# Patient Record
Sex: Female | Born: 1990
Health system: Southern US, Community
[De-identification: ages and names within clinical notes are randomized; demographics above are authoritative.]

## PROBLEM LIST (undated history)

## (undated) DIAGNOSIS — E669 Obesity, unspecified: Secondary | ICD-10-CM

## (undated) DIAGNOSIS — I10 Essential (primary) hypertension: Secondary | ICD-10-CM

## (undated) HISTORY — PX: TONSILLECTOMY: SUR1361

## (undated) HISTORY — PX: CHOLECYSTECTOMY: SHX55

---
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
## 2009-02-28 ENCOUNTER — Ambulatory Visit: Payer: Self-pay | Admitting: Family Medicine

## 2009-05-18 ENCOUNTER — Ambulatory Visit: Payer: Self-pay | Admitting: Family Medicine

## 2009-08-03 ENCOUNTER — Ambulatory Visit: Payer: Self-pay | Admitting: Family Medicine

## 2009-09-05 ENCOUNTER — Emergency Department (HOSPITAL_COMMUNITY): Admission: EM | Admit: 2009-09-05 | Discharge: 2009-09-06 | Payer: Self-pay | Admitting: Emergency Medicine

## 2009-10-05 ENCOUNTER — Emergency Department (HOSPITAL_COMMUNITY): Admission: EM | Admit: 2009-10-05 | Discharge: 2009-10-05 | Payer: Self-pay | Admitting: Emergency Medicine

## 2009-10-19 ENCOUNTER — Ambulatory Visit: Payer: Self-pay | Admitting: Family Medicine

## 2010-01-12 ENCOUNTER — Ambulatory Visit: Payer: Self-pay | Admitting: Family Medicine

## 2010-03-06 ENCOUNTER — Emergency Department (HOSPITAL_COMMUNITY): Admission: EM | Admit: 2010-03-06 | Discharge: 2010-03-06 | Payer: Self-pay | Admitting: Family Medicine

## 2010-04-20 ENCOUNTER — Emergency Department (HOSPITAL_COMMUNITY): Admission: EM | Admit: 2010-04-20 | Discharge: 2010-04-20 | Payer: Self-pay | Admitting: Emergency Medicine

## 2010-11-16 ENCOUNTER — Emergency Department (HOSPITAL_COMMUNITY): Admission: EM | Admit: 2010-11-16 | Discharge: 2010-11-16 | Payer: Self-pay | Admitting: Family Medicine

## 2010-12-31 NOTE — L&D Delivery Note (Cosign Needed)
Delivery Note At 4:05 AM a viable female was delivered via Vaginal, Spontaneous Delivery (Presentation: Middle Occiput Anterior).  APGAR: 6, 8; weight- not yet done Placenta status: Intact, Spontaneous via shultz.  Cord: 3 vessels with the following complications: None.  Infant placed on maternal abdomen, cord clamped x 2 and cut by fob, infant taken to warmer to be cared for by NICU team.  Anesthesia: Epidural  Episiotomy: None Lacerations: 1st degree perineal Suture Repair: 3.0 vicryl Est. Blood Loss (mL): Mom to postpartum/Women's unit.  Baby to NICU.  Zerita Boers, CNM and NICU team in attendance of birth.  Joellyn Haff, SNM 12/24/2011, 4:51 AM

## 2011-01-30 NOTE — Assessment & Plan Note (Signed)
Summary: depo inj,tcb  Nurse Visit   Vitals Entered By: Gladstone Pih (January 12, 2010 4:04 PM)  Medication Administration  Injection # 1:    Medication: Depo-Provera 150mg     Diagnosis: CONTRACEPTIVE MANAGEMENT NOS (ICD-V25.9)    Route: IM    Site: L deltoid    Exp Date: 03/2012    Lot #: [id number]    Mfr: greenstone    Comments: next inj due Mar 13-Apr14 2011, pt given card with dates to sched next visit    Patient tolerated injection without complications    Given by: Gladstone Pih (January 12, 2010 3:14 PM)  Orders Added: 1)  Depo-Provera 150mg  [J1055] 2)  Admin of Injection (IM/SQ) [25427]

## 2011-02-17 ENCOUNTER — Emergency Department (HOSPITAL_COMMUNITY)
Admission: EM | Admit: 2011-02-17 | Discharge: 2011-02-17 | Discharge status: Against Medical Advice | Payer: Self-pay | Attending: Emergency Medicine | Admitting: Emergency Medicine

## 2011-02-17 DIAGNOSIS — J45909 Unspecified asthma, uncomplicated: Secondary | ICD-10-CM | POA: Insufficient documentation

## 2011-02-24 ENCOUNTER — Emergency Department (HOSPITAL_COMMUNITY)
Admission: EM | Admit: 2011-02-24 | Discharge: 2011-02-24 | Disposition: A | Discharge status: Home / Self Care | Payer: Self-pay | Attending: Emergency Medicine | Admitting: Emergency Medicine

## 2011-02-24 DIAGNOSIS — J45909 Unspecified asthma, uncomplicated: Secondary | ICD-10-CM | POA: Insufficient documentation

## 2011-02-24 DIAGNOSIS — R0789 Other chest pain: Secondary | ICD-10-CM | POA: Insufficient documentation

## 2011-02-24 DIAGNOSIS — R05 Cough: Secondary | ICD-10-CM | POA: Insufficient documentation

## 2011-02-24 DIAGNOSIS — R059 Cough, unspecified: Secondary | ICD-10-CM | POA: Insufficient documentation

## 2011-03-13 LAB — POCT URINALYSIS DIPSTICK
Glucose, UA: NEGATIVE mg/dL
Hgb urine dipstick: NEGATIVE
Nitrite: NEGATIVE
Urobilinogen, UA: 0.2 mg/dL (ref 0.0–1.0)
pH: 7 (ref 5.0–8.0)

## 2011-04-28 ENCOUNTER — Emergency Department (HOSPITAL_COMMUNITY)
Admission: EM | Admit: 2011-04-28 | Discharge: 2011-04-28 | Disposition: A | Discharge status: Home / Self Care | Payer: Self-pay | Attending: Emergency Medicine | Admitting: Emergency Medicine

## 2011-04-28 DIAGNOSIS — Z760 Encounter for issue of repeat prescription: Secondary | ICD-10-CM | POA: Insufficient documentation

## 2011-04-28 DIAGNOSIS — J45909 Unspecified asthma, uncomplicated: Secondary | ICD-10-CM | POA: Insufficient documentation

## 2011-05-18 NOTE — H&P (Signed)
NAMESHAMELA, HAYDON             ACCOUNT NO.:  [id number]   MEDICAL RECORD NO.:  [id number]          PATIENT TYPE:  INP   LOCATION:  6120                         FACILITY:  [facility]   PHYSICIAN:  Leighton Roach McDiarmid, M.D.DATE OF BIRTH:  [birth year]   DATE OF ADMISSION:  05/07/2005  DATE OF DISCHARGE:                                HISTORY & PHYSICAL   CHIEF COMPLAINT:  Abdominal pain and vomiting.   HISTORY OF PRESENT ILLNESS:  A 20 year old African-American female,  previously healthy other than a remote history of asthma.  Awoke from sleep  at 0100 this morning with sharp epigastric pain, constant, and had  associated nausea and vomiting.  Her pain has worsened since it initially  started and remains in her epigastric region with radiation to her back.  She presented to urgent care this morning with her mom, and a CT of the  abdomen was performed.  Per verbal report to Dr. Levie Heritage, this showed  gallbladder disease.  She was sent over to [facility] pediatric floor for  admission.  She continues to have epigastric pain with radiation toward her  back.  Her pain is currently rated a 9.5/10.   REVIEW OF SYSTEMS:  CONSTITUTIONAL:  She is afebrile without chills but does  feel cold.  CARDIOVASCULAR:  Denies any chest pains or palpitations.  GASTROINTESTINAL:  Nausea and vomiting without diarrhea and epigastric pain.  NEUROLOGIC:  Denies any current headache but has a history of migraines.  ENT:  Rhinorrhea, sneezing and cough with a history of seasonal allergies.  GENITOURINARY:  Denies any dysuria or vaginal discharge.  RESPIRATORY:  Denies any cough or shortness of breath.  SKIN:  Denies any new rash but has  atopic dermatitis of her belt line and legs.  Her LMP was one week ago.   PAST MEDICAL HISTORY:  1.  Allergic rhinitis.  2.  Intermittent asthma.  3.  Migraine headaches.  4.  Obesity.   MEDICATIONS:  1.  Albuterol MDI p.r.n.  2.  Tylenol, last dose this morning.  3.  Ortho  Tri-Cyclen birth control pills daily.   ALLERGIES:  No known drug allergies.   PAST SURGICAL HISTORY:  T&A by Dr. Haroldine Laws in June 2004.   SOCIAL HISTORY:  She lives at home with her mom, stepfather and sister.  Gets along well with her stepfather.  She is active, plays sports, and is  not sexually active and does not smoke.   FAMILY HISTORY:  Her mom is healthy.  She has a distant history of coronary  artery disease and asthma.   PHYSICAL EXAMINATION:  VITAL SIGNS:  Temperature 36.5 Celsius, pulse of 85,  respiratory rate of 20, blood pressure of 112/70.  Weight is 145 pounds.  GENERAL:  A slightly overweight African-American female in moderate  distress, moaning and lying in a fetal position, alert and oriented x3.  HEENT:  Head normocephalic, atraumatic.  Pupils equal, round, and reactive.  Extraocular movements are intact.  Oropharynx is without injection.  No  tonsil was present.  Dry, pink mucous membranes.  No exudates of the palate.  NECK:  Supple without thyromegaly or lymphadenopathy.  CHEST:  Lungs clear to auscultation bilaterally, nonlabored respirations.  CARDIOVASCULAR:  Regular rate and rhythm without murmurs, 2+ dorsalis pedis  and radial pulses.  EXTREMITIES:  Without edema, clubbing or cyanosis.  ABDOMEN:  Normoactive bowel sounds.  Epigastric and right upper quadrant  voluntary guarding.  No rebound tenderness.  Positive for left CVA  tenderness.  Positive for Murphy's sign, right upper quadrant.  SKIN:  Lichenification under the umbilicus and of the legs, in the popliteal  fossae.  NEUROLOGIC:  Cranial nerves II-XII grossly intact bilaterally.  Answers  questions appropriately.  RECTAL:  Exam not performed.   LABORATORY DATA:  CBC, CMET, GGT, UA, __________, amylase, lipase and CT  abdomen pending.   ASSESSMENT AND PLAN:  A 20 year old African-American female admitted for  cholecystitis per verbal report to Dr. Levie Heritage on CT scan of the abdomen.   1.   Cholecystitis.  Await the final read of her CT scan to confirm acute      cholecystitis versus other differential.  Look for obstructing stone and      sludge of the biliary tract.  Check her LFTs along with alkaline      phosphatase, amylase and lipase to rule out associated pancreatitis if      this is from a gallstone.  2.  Epigastric pain.  Likely acute cholecystitis given the verbal report of      her CT scan, but other differentials in the diagnosis include acute      appendicitis, pancreatitis, gastritis, ectopic pregnancy, PID, although      she denies being sexually active.  Surgical consult is pending.  Will      keep her n.p.o. in case she does need to go to surgery.  Will give her      Toradol for pain and Zofran for nausea.  3.  Asthma, distant history.  Asymptomatic at this time.  Will give her      albuterol MDI if needed.  4.  Fluids, electrolytes, and nutrition.  She is slightly dehydrated but not      tachycardic.  Will start her off with 500 mL of normal saline bolus and      then give her maintenance IV fluids with D5 half normal saline at 150      mL/hr.  Will check her potassium and give her repletion if she needs it      in her IV fluids.      KB/MEDQ  D:  05/07/2005  T:  05/07/2005  Job:  [id number]   cc:   Tawnya Crook Erenest Rasher, M.D.  [address]  [place], [place] [address]  Fax: [phone]

## 2011-05-18 NOTE — H&P (Signed)
   NAME:  [name], STARRETT                       ACCOUNT NO.:  [id number]   MEDICAL RECORD NO.:  [id number]                   PATIENT TYPE:  AMB   LOCATION:  DSC                                  FACILITY:  [facility]   PHYSICIAN:  Hermelinda Medicus, M.D.                DATE OF BIRTH:  1991-10-08   DATE OF ADMISSION:  DATE OF DISCHARGE:                                HISTORY & PHYSICAL   HISTORY OF PRESENT ILLNESS:  This patient is a 20 year old female  who has  had a history of tonsillitis going back to 1999. She has had apparently  when she had larger adenoids she did have some snoring problems but this has  decreased, but the tonsillitis problems continue to be a problem. At this  point she has resolved her former adenoid hypertrophy and has very small  adenoids at age 3. However, she continues to have her tonsillitis  difficulties.   PAST MEDICAL HISTORY:  Her past history is quite unremarkable. She has been  clear  of infection but has continuing exudate on these tonsils. She has had  no surgeries in the past, no hospitalizations. She was 4 weeks premature at  4 pounds, 6 ounces but no bowel, bladder, kidney, respiratory or heart  problems.   ALLERGIES:  No allergies to medications.   CURRENT MEDICATIONS:  1. Albuterol occasionally p.r.n.  2. Benadryl occasionally.   PHYSICAL EXAMINATION:  VITAL SIGNS:  Blood pressure 123/79, pulse 80.  GENERAL:  She is 5 feet, 3 inches, weight 150.  HEENT:  Ears are clear. Tympanic membranes are clear. Nose is clear. The  tonsils are still exudative, although I do not feel she needs to be on  antibiotics at this time. Larynx is clear. The true cords, false cords,  epiglottis and base of tongue are clear.  NECK:  Free of any thyromegaly, cervical adenopathy or mass.  CHEST:  No wheezing, rales or rhonchi.  CARDIOVASCULAR:  No murmur, rub or gallops.  ABDOMEN:  Free of any organomegaly, tenderness  or mass.  EXTREMITIES:  Unremarkable.   DIAGNOSIS:  Initial diagnosis is that of tonsillitis. History of tonsillitis  documented back to 1999.                                                Hermelinda Medicus, M.D.    JC/MEDQ  D:  06/18/2003  T:  06/19/2003  Job:  [id number]   cc:   Altamease Oiler C. Merilynn Finland, M.D.  Fam. Med - Resident - [place], [place] [address]  Fax: [phone]    cc:   Osie Bond. Merilynn Finland, M.D.  Fam. Med - Resident - [place], [place] [address]  Fax: [phone]

## 2011-05-18 NOTE — Discharge Summary (Signed)
[name], [name]             ACCOUNT NO.:  [id number]   MEDICAL RECORD NO.:  [id number]          PATIENT TYPE:  INP   LOCATION:  6120                         FACILITY:  [facility]   PHYSICIAN:  Henri Medal, MDDATE OF BIRTH:  1991-03-12   DATE OF ADMISSION:  05/07/2005  DATE OF DISCHARGE:  05/10/2005                                 DISCHARGE SUMMARY   CONSULTATIONS:  Prabhakar D. Pendse, M.D.   DISCHARGE MEDICATIONS:  Tylenol No. 3 one tab p.o. q.4-6h. p.r.n. pain.   HOSPITAL COURSE:  This 20 year old African-American female complaining of  abdominal pain and vomiting.  The patient was previously healthy, awakened  from her sleep at 1 a.m. the night previous to admission with sharp  epigastric pain, constant with associated nausea and vomiting.  The pain  worsened when she presented at [facility] the morning of admission.  Abdominal CT  showed gallbladder disease.  The patient continued to have a epigastric pain  with radiation to the back.  During physical examination pain 9.5/10.   PHYSICAL EXAMINATION:  GENERAL:  The patient was in moderate distress,  moaning in fetal position.  The patient was oriented x3.  ABDOMEN:  Epigastric and right upper quadrant pain with voluntary guarding,  NABS, no rebound.  Left CVA tenderness.  Positive Murphy sign.   LABORATORY DATA:  On May 08, 2005 WBC 5.8, hemoglobin 11.8, hematocrit 33.2,  platelets 295.  On differential:  N:  66%, L: 18%, M: 11%.  AST 33, ALT 25.  Sodium 139, potassium 3.3, chloride 108, CO2 26, BUN 3, creatinine 0.9,  glucose 120.  Ultrasound showed cholelithiasis, no acute cholecystitis, no  biliary dilation.   HOSPITAL COURSE:  Problem 1. Acute cholelithiasis.  The patient was  clinically stable but still complaining of abdominal pain.  The patient was  afebrile with a white blood cell count within normal limits.  A laparoscopic  cholecystectomy was done on May 14, 2005 with no complications.  The patient  tolerated well  the procedure.  Laboratory data on day of discharge sodium  137, potassium 3.8, chloride 106, CO2 25, BUN 3, creatinine 0.9, glucose 96.  The patient's pain was controlled with medications.  The patient tolerated a  clear.  No nausea or vomiting.  The patient was discharged home with a  followup appointment with Dr. Levie Heritage at [facility] on May 17, 2005 at 2:30 p.m.  Abdominal physical examination wound no signs of  inflammatory/infection.   DISCHARGE CONDITION:  Stable.   SPECIAL INSTRUCTIONS:  1.  Diet:  Clear on May 10, 2005, fluid liquid diet on May 11, 2005 as      tolerated.  Diet can be advanced as tolerated only after two bowel      movements.  2.  Wound care:  Apply Neosporin on wound areas.  3.  Pain management:  Tylenol No. 3 for pain (one tablet q.4-6h. p.r.n.      pain).  If pain is mild discontinue Tylenol No. 3 and use regular      Tylenol 500 mg one tab p.o. q.6h.   FOLLOWUP:  Dr.  Pendse on [facility] on May 17, 2005 at 2:30  p.m.      FIM/MEDQ  D:  05/10/2005  T:  05/10/2005  Job:  [id number]   cc:   Dr. Levie Heritage (fax) [facility]   Tawnya Crook Erenest Rasher, M.D.  [address]  [place], [place] [address]  Fax: [phone]

## 2011-05-18 NOTE — Op Note (Signed)
NAMEMELANA, HINGLE             ACCOUNT NO.:  [id number]   MEDICAL RECORD NO.:  [id number]          PATIENT TYPE:  INP   LOCATION:  6120                         FACILITY:  [facility]   PHYSICIAN:  Prabhakar D. Pendse, M.D.DATE OF BIRTH:  [birth year]   DATE OF PROCEDURE:  05/09/2005  DATE OF DISCHARGE:                                 OPERATIVE REPORT   PREOPERATIVE DIAGNOSIS:  Acute cholecystitis with cholelithiasis.   POSTOPERATIVE DIAGNOSIS:  Acute cholecystitis with cholelithiasis.   OPERATION PERFORMED:  Laparoscopic cholecystectomy.   SURGEON:  Prabhakar D. Levie Heritage, M.D.   ASSISTANT:  Leonia Corona, M.D.   ANESTHESIA:  Nurse.   OPERATIVE INDICATIONS:  This 20 year old girl was referred by Dr. Royston Sinner  Corrington with about an 8-hour history of severe right-sided abdominal pain  associated with nausea and vomiting, low-grade fever and no history of URI  and no diarrhea.  Physical findings were consistent with possible  cholecystitis.  CT scan was done which showed findings consistent with a  possible gallstone.  The patient was dehydrated, hence IV fluids were given.  Pain medications were given.  Ultrasound examination was done to confirm the  findings of cholelithiasis.  Chemistries, including liver function tests  were unremarkable.  Hence, the patient was treated in a supportive manner  and a laparoscopic cholecystectomy was planned.   OPERATIVE PROCEDURE:  Under satisfactory general endotracheal anesthesia and  the patient in the supine position, the abdomen was thoroughly prepped and  draped in the usual manner.  There was a large patch of an eczematous rash  around umbilicus.  The area was cleansed and an incision was made through  this eczematous area in the infraumbilical location.  The dissection was  carried through the layers of the abdominal wall and the peritoneal cavity  entered.  A Hassan 10-mm port was placed and camera was introduced.  Careful  examination  showed findings consistent with acute cholecystitis with the  gallbladder intensely filled with gallbladder contents.  No other obvious  abnormalities were noted.  Two lateral ports 5 mm in dimension were placed  and the gallbladder was grasped and held under traction.  Another 10-mm port  was placed through the midepigastric area and with direct vision with the  camera the dissector was placed through this epigastric port and the  dissection was started.  There seemed to be marked prominence of the  infundibulum of the gallbladder and this was dissected free.  During freeing  of the infundibulum there was a perforation made in the gallbladder through  which a few stones exuded which were appropriately grasped and removed.  At  one time it was felt that the cystic duct could have been accidentally  avulsed, however, further dissection indicated that indeed the cystic duct  was intact. This was identified and followed through the common duct.  At  this time, the cystic duct was doubly clipped with hemoclips and divided.  The cystic artery was identified, doubly clipped and divided.  The  gallbladder being filled with the stone and the preoperative reading of  common duct measurements being normal as well as the  chemistries being  normal, intraoperative cholangiogram was deferred.  Now by electrocautery  with the spatula as well as the hook, the gallbladder was gradually  dissected from the gallbladder fossa.  A significant portion of the  gallbladder was in the intrahepatic position, hence it was difficult to  dissect and remove the gallbladder.  Nevertheless, this was done with  patient dissection.  The bag placed in and the gallbladder was retrieved and  removed.  A few stones spilled which were grasped and removed.  The entire  abdominal cavity was irrigated with saline, hemostasis accomplished and all  the ports were removed under direct vision with the camera.  There was no  bleeding  noted and all the incisions were infiltrated with 0.25% Marcaine  with epinephrine and repaired with staples only and in a loose manner  because of the eczematoid rash in the area of the incisions.  The incisions  were now cleansed and appropriately dressed.   Throughout the procedure, the patient's vital signs remained stable. The  patient withstood the procedure well and was transferred to the recovery  room in satisfactory general condition.      PDP/MEDQ  D:  05/09/2005  T:  05/09/2005  Job:  [id number]   cc:   Delano Metz, M.D.  [address]  [place]  [place] [address]  Fax: [phone]   Danella Deis, M.D.  [address]  [place], [place] [address]

## 2011-05-18 NOTE — Op Note (Signed)
   NAME:  [name], [name]                       ACCOUNT NO.:  [id number]   MEDICAL RECORD NO.:  [id number]                   PATIENT TYPE:  AMB   LOCATION:  DSC                                  FACILITY:  [facility]   PHYSICIAN:  Hermelinda Medicus, M.D.                DATE OF BIRTH:  July 25, 1991   DATE OF PROCEDURE:  DATE OF DISCHARGE:                                 OPERATIVE REPORT   PREOPERATIVE DIAGNOSIS:  Tonsillitis, history of tonsillitis since 1999.   POSTOPERATIVE DIAGNOSIS:  Tonsillitis, history of tonsillitis since 1999.   SURGEON:  Hermelinda Medicus, M.D.   ANESTHESIA:  General endotracheal anesthesia with Dr. Kaylyn Layer. [place].   DESCRIPTION OF PROCEDURE:  The patient was placed in the supine position and  under general endotracheal anesthesia the tonsillar gag was placed. The  adenoids were checked and found to be extremely small  and no longer  obstructive as described in previous notes when the child was younger.   The tonsils are showing considerable exudate and are moderate in size and  showed no indication of further history of sleep apneas as described in the  past. The tonsils, however, showed considerable exudate, and when grasped,  considerably more exudate exuded from these. They are removed using the  Bovie electrocoagulation, blunt dissection. All hemostasis was established  using Bovie electrocoagulation.   Once the tonsils were removed the tonsillar beds were carefully examined and  carefully  cauterized all tonsillar bleeders. Once all hemostasis was  established the nasopharynx was suctioned. The stomach was suctioned. The  gag was slowly released. All bleeding was stopped. The total blood loss was  estimated at less than 25 cc.   The patient tolerated the procedure well. She is doing well postoperatively.  Follow up will be in 5 days, 3 weeks and 6 weeks.                                               Hermelinda Medicus, M.D.    JC/MEDQ  D:  06/18/2003  T:   06/21/2003  Job:  [id number]   cc:   Altamease Oiler C. Merilynn Finland, M.D.  Fam. Med - Resident - [place], [place] [address]  Fax: [phone]

## 2011-06-21 ENCOUNTER — Inpatient Hospital Stay (INDEPENDENT_AMBULATORY_CARE_PROVIDER_SITE_OTHER)
Admission: RE | Admit: 2011-06-21 | Discharge: 2011-06-21 | Disposition: A | Discharge status: Home / Self Care | Payer: Self-pay | Source: Ambulatory Visit | Attending: Family Medicine | Admitting: Family Medicine

## 2011-06-21 DIAGNOSIS — Z3201 Encounter for pregnancy test, result positive: Secondary | ICD-10-CM

## 2011-06-21 LAB — POCT URINALYSIS DIP (DEVICE)
Hgb urine dipstick: NEGATIVE
Nitrite: NEGATIVE
Urobilinogen, UA: 1 mg/dL (ref 0.0–1.0)
pH: 6.5 (ref 5.0–8.0)

## 2011-06-22 ENCOUNTER — Inpatient Hospital Stay (HOSPITAL_COMMUNITY): Payer: Medicaid Other

## 2011-06-22 ENCOUNTER — Inpatient Hospital Stay (HOSPITAL_COMMUNITY)
Admission: AD | Admit: 2011-06-22 | Discharge: 2011-06-22 | Disposition: A | Discharge status: Home / Self Care | Payer: Medicaid Other | Source: Ambulatory Visit | Attending: Obstetrics & Gynecology | Admitting: Obstetrics & Gynecology

## 2011-06-22 DIAGNOSIS — Z79899 Other long term (current) drug therapy: Secondary | ICD-10-CM | POA: Insufficient documentation

## 2011-06-22 DIAGNOSIS — O2390 Unspecified genitourinary tract infection in pregnancy, unspecified trimester: Secondary | ICD-10-CM | POA: Insufficient documentation

## 2011-06-22 DIAGNOSIS — O30009 Twin pregnancy, unspecified number of placenta and unspecified number of amniotic sacs, unspecified trimester: Secondary | ICD-10-CM

## 2011-06-22 DIAGNOSIS — J45909 Unspecified asthma, uncomplicated: Secondary | ICD-10-CM | POA: Insufficient documentation

## 2011-06-22 DIAGNOSIS — A499 Bacterial infection, unspecified: Secondary | ICD-10-CM | POA: Insufficient documentation

## 2011-06-22 DIAGNOSIS — O99891 Other specified diseases and conditions complicating pregnancy: Secondary | ICD-10-CM | POA: Insufficient documentation

## 2011-06-22 DIAGNOSIS — O9989 Other specified diseases and conditions complicating pregnancy, childbirth and the puerperium: Secondary | ICD-10-CM

## 2011-06-22 DIAGNOSIS — R109 Unspecified abdominal pain: Secondary | ICD-10-CM | POA: Insufficient documentation

## 2011-06-22 DIAGNOSIS — N760 Acute vaginitis: Secondary | ICD-10-CM | POA: Insufficient documentation

## 2011-06-22 DIAGNOSIS — B9689 Other specified bacterial agents as the cause of diseases classified elsewhere: Secondary | ICD-10-CM | POA: Insufficient documentation

## 2011-06-22 LAB — URINE MICROSCOPIC-ADD ON

## 2011-06-22 LAB — URINALYSIS, ROUTINE W REFLEX MICROSCOPIC
Bilirubin Urine: NEGATIVE
Glucose, UA: NEGATIVE mg/dL
Hgb urine dipstick: NEGATIVE
Protein, ur: NEGATIVE mg/dL
Urobilinogen, UA: 0.2 mg/dL (ref 0.0–1.0)

## 2011-06-22 LAB — CBC
HCT: 37.9 % (ref 36.0–46.0)
MCHC: 34.8 g/dL (ref 30.0–36.0)
MCV: 84 fL (ref 78.0–100.0)
Platelets: 353 10*3/uL (ref 150–400)
RDW: 15.5 % (ref 11.5–15.5)
WBC: 7 10*3/uL (ref 4.0–10.5)

## 2011-06-22 LAB — WET PREP, GENITAL
Trich, Wet Prep: NONE SEEN
Yeast Wet Prep HPF POC: NONE SEEN

## 2011-06-29 ENCOUNTER — Ambulatory Visit (HOSPITAL_COMMUNITY)
Admit: 2011-06-29 | Discharge: 2011-06-29 | Disposition: A | Discharge status: Home / Self Care | Payer: Medicaid Other | Attending: Obstetrics & Gynecology | Admitting: Obstetrics & Gynecology

## 2011-06-29 ENCOUNTER — Inpatient Hospital Stay (HOSPITAL_COMMUNITY)
Admission: AD | Admit: 2011-06-29 | Discharge: 2011-06-29 | Disposition: A | Discharge status: Home / Self Care | Payer: Self-pay | Source: Ambulatory Visit | Attending: Obstetrics & Gynecology | Admitting: Obstetrics & Gynecology

## 2011-06-29 ENCOUNTER — Encounter (HOSPITAL_COMMUNITY): Payer: Self-pay

## 2011-06-29 DIAGNOSIS — Z3689 Encounter for other specified antenatal screening: Secondary | ICD-10-CM | POA: Insufficient documentation

## 2011-06-29 DIAGNOSIS — O209 Hemorrhage in early pregnancy, unspecified: Secondary | ICD-10-CM

## 2011-06-29 DIAGNOSIS — R109 Unspecified abdominal pain: Secondary | ICD-10-CM | POA: Insufficient documentation

## 2011-06-29 DIAGNOSIS — O99891 Other specified diseases and conditions complicating pregnancy: Secondary | ICD-10-CM | POA: Insufficient documentation

## 2011-07-25 ENCOUNTER — Encounter (HOSPITAL_COMMUNITY): Payer: Self-pay

## 2011-07-25 ENCOUNTER — Inpatient Hospital Stay (HOSPITAL_COMMUNITY)
Admission: AD | Admit: 2011-07-25 | Discharge: 2011-07-25 | Disposition: A | Discharge status: Home / Self Care | Payer: BC Managed Care – PPO | Source: Ambulatory Visit | Attending: Obstetrics and Gynecology | Admitting: Obstetrics and Gynecology

## 2011-07-25 DIAGNOSIS — O209 Hemorrhage in early pregnancy, unspecified: Secondary | ICD-10-CM | POA: Insufficient documentation

## 2011-07-25 LAB — WET PREP, GENITAL
Trich, Wet Prep: NONE SEEN
Yeast Wet Prep HPF POC: NONE SEEN

## 2011-07-25 LAB — URINE MICROSCOPIC-ADD ON

## 2011-07-25 LAB — URINALYSIS, ROUTINE W REFLEX MICROSCOPIC
Bilirubin Urine: NEGATIVE
Glucose, UA: NEGATIVE mg/dL
Specific Gravity, Urine: >1.03 — ABNORMAL HIGH (ref 1.005–1.030)
pH: 6 (ref 5.0–8.0)

## 2011-07-25 NOTE — Progress Notes (Signed)
Pt states she had a sudden onset of bleeding at 1430, no pain. Pt has not pad on but the R side of her panties are saturated with dark red blood with a foul odor.

## 2011-07-25 NOTE — ED Provider Notes (Cosign Needed Addendum)
Informal bedside ultrasound shows viable IUP.  I was able to doppler FHT at 170's. Discussed with patient, bleeding during pregnancy and threatened AB.  [place], [place] 08/04/11 1734

## 2011-07-25 NOTE — Progress Notes (Signed)
Pt states started bleeding at 1430 this afternoon, last intercourse Sunday, now spotting. When pt first saw blood, was dark, no clots noted. Denies pain.

## 2011-07-25 NOTE — ED Provider Notes (Addendum)
History     Chief Complaint  Patient presents with  . Vaginal Bleeding   Vaginal Bleeding  Patient is a 20 y.o. female presenting with vaginal bleeding.  Vaginal Bleeding      Past Medical History  Diagnosis Date  . Asthma     Past Surgical History  Procedure Date  . Cholecystectomy     History reviewed. No pertinent family history.  History  Substance Use Topics  . Smoking status: Never Smoker   . Smokeless tobacco: Never Used  . Alcohol Use: No    Allergies: No Known Allergies  No prescriptions prior to admission    Review of Systems  Genitourinary: Positive for vaginal bleeding.   Physical Exam   Blood pressure 133/70, pulse 108, temperature 99.3 F (37.4 C), temperature source Oral, resp. rate 16, height 5' 2.5" (1.588 m), weight 219 lb (99.338 kg), SpO2 100.00%.  Physical Exam  MAU Course  Procedures  MDM Assessment: bleeding in first trimester pregnancy Plan: Inst. To patient regarding threatened AB.   Assessment and Plan    Avni Traore 08/04/2011, 5:35 PM               [facility] NP 08/04/11 [address] [place], NP 08/04/11 [address] [place], NP 08/07/11 0049  [place], NP 09/07/11 [phone]

## 2011-07-25 NOTE — ED Provider Notes (Deleted)
History   [name] is a 20 y.o. G1 at 10 wks. Gest. By early ultrasound here. She states that when she was five weeks pregnant her ultrasound showed an IUP and [facility]. She is here today with bleeding but no pain. Last sexual intercourse 3 days ago.   Chief Complaint  Patient presents with  . Vaginal Bleeding   Vaginal Bleeding The patient's primary symptoms include vaginal bleeding. This is a new problem. The current episode started today. The problem has been gradually improving. The patient is experiencing no pain. She is pregnant. Associated symptoms include back pain, constipation and nausea. Pertinent negatives include no abdominal pain, chills, diarrhea, dysuria, fever, frequency, headaches, sore throat, urgency or vomiting. The vaginal bleeding is lighter than menses. She has not been passing clots. She has not been passing tissue. The symptoms are aggravated by nothing. She has tried nothing for the symptoms. She is sexually active. She uses nothing for contraception.   [name] is a 20 y.o. AA female at 10 wks. Gest with vaginal bleeding that started today. The bleeding began bright red like a period and then got less until now is only dark brown. She denies any pain.  OB History    Grav Para Term Preterm Abortions TAB SAB Ect Mult Living   1               Past Medical History  Diagnosis Date  . Asthma     Past Surgical History  Procedure Date  . Cholecystectomy     History reviewed. No pertinent family history.  History  Substance Use Topics  . Smoking status: Never Smoker   . Smokeless tobacco: Never Used  . Alcohol Use: No    Allergies: No Known Allergies  Prescriptions prior to admission  Medication Sig Dispense Refill  . prenatal vitamin w/FE, FA (PRENATAL 1 + 1) 27-1 MG TABS Take 1 tablet by mouth daily.          Review of Systems  Constitutional: Positive for malaise/fatigue. Negative for fever, chills and weight loss.  HENT: Negative for  ear pain, congestion, sore throat and neck pain.   Eyes: Positive for blurred vision. Negative for double vision and photophobia.  Cardiovascular: Negative for chest pain, palpitations and leg swelling.  Gastrointestinal: Positive for nausea and constipation. Negative for heartburn, vomiting, abdominal pain and diarrhea.  Genitourinary: Positive for vaginal bleeding. Negative for dysuria, urgency and frequency.  Musculoskeletal: Positive for back pain.  Skin: Negative.   Neurological: Negative for dizziness, speech change, focal weakness and headaches.  Endo/Heme/Allergies: Negative.    Physical Exam   Blood pressure 140/81, pulse 116, temperature 99.3 F (37.4 C), temperature source Oral, resp. rate 16, height 5' 2.5" (1.588 m), weight 99.338 kg (219 lb), SpO2 100.00%.  Physical Exam  Nursing note and vitals reviewed. Constitutional: She is oriented to person, place, and time. She appears well-developed and well-nourished.  HENT:  Head: Normocephalic and atraumatic.  Eyes: EOM are normal.  Neck: Neck supple.  Respiratory: Effort normal.  GI: Soft. There is no tenderness.  Genitourinary: Uterus is enlarged. Cervix exhibits no motion tenderness. Right adnexum displays no tenderness. Left adnexum displays no tenderness.       Small amount of blood in vaginal vault. Cervix long and closed.   Musculoskeletal: Normal range of motion.  Neurological: She is alert and oriented to person, place, and time.  Skin: Skin is warm and dry.    MAU Course  Procedures  MDM  Assessment and Plan  Assessment: bleeding in first trimester pregnancy  Plan: Inst. To patient regarding threatened AB.    Claire Bridge 07/25/2011, 3:48 PM

## 2011-08-15 ENCOUNTER — Emergency Department (HOSPITAL_COMMUNITY)
Admission: EM | Admit: 2011-08-15 | Discharge: 2011-08-15 | Disposition: A | Discharge status: Home / Self Care | Payer: Self-pay | Attending: Emergency Medicine | Admitting: Emergency Medicine

## 2011-08-15 DIAGNOSIS — J45909 Unspecified asthma, uncomplicated: Secondary | ICD-10-CM | POA: Insufficient documentation

## 2011-08-28 ENCOUNTER — Other Ambulatory Visit: Payer: Medicaid Other

## 2011-08-28 DIAGNOSIS — Z3480 Encounter for supervision of other normal pregnancy, unspecified trimester: Secondary | ICD-10-CM

## 2011-08-28 NOTE — Progress Notes (Signed)
Prenatal labs done today marci holder 

## 2011-08-29 ENCOUNTER — Other Ambulatory Visit: Payer: Self-pay | Admitting: Family Medicine

## 2011-08-29 LAB — OBSTETRIC PANEL
Eosinophils Relative: 2 % (ref 0–5)
HCT: 36.7 % (ref 36.0–46.0)
Hemoglobin: 12.2 g/dL (ref 12.0–15.0)
Lymphocytes Relative: 16 % (ref 12–46)
Lymphs Abs: 1.2 10*3/uL (ref 0.7–4.0)
MCV: 85.3 fL (ref 78.0–100.0)
Monocytes Relative: 7 % (ref 3–12)
Platelets: 365 10*3/uL (ref 150–400)
RBC: 4.3 MIL/uL (ref 3.87–5.11)
Rh Type: POSITIVE
Rubella: 33.2 IU/mL — ABNORMAL HIGH
WBC: 7.8 10*3/uL (ref 4.0–10.5)

## 2011-08-29 LAB — SICKLE CELL SCREEN: Sickle Cell Screen: NEGATIVE

## 2011-09-04 ENCOUNTER — Other Ambulatory Visit (HOSPITAL_COMMUNITY)
Admission: RE | Admit: 2011-09-04 | Discharge: 2011-09-04 | Disposition: A | Discharge status: Home / Self Care | Payer: Medicaid Other | Source: Ambulatory Visit | Attending: Family Medicine | Admitting: Family Medicine

## 2011-09-04 ENCOUNTER — Encounter: Payer: Self-pay | Admitting: Family Medicine

## 2011-09-04 ENCOUNTER — Ambulatory Visit (INDEPENDENT_AMBULATORY_CARE_PROVIDER_SITE_OTHER): Payer: Medicaid Other | Admitting: Family Medicine

## 2011-09-04 DIAGNOSIS — Z01419 Encounter for gynecological examination (general) (routine) without abnormal findings: Secondary | ICD-10-CM | POA: Insufficient documentation

## 2011-09-04 DIAGNOSIS — Z124 Encounter for screening for malignant neoplasm of cervix: Secondary | ICD-10-CM

## 2011-09-04 DIAGNOSIS — Z3480 Encounter for supervision of other normal pregnancy, unspecified trimester: Secondary | ICD-10-CM

## 2011-09-04 LAB — GLUCOSE, CAPILLARY: Glucose-Capillary: 113 mg/dL — ABNORMAL HIGH (ref 70–99)

## 2011-09-04 NOTE — Patient Instructions (Addendum)
Please make a follow-up appointment at the [facility] clinic in 4 weeks and with me in 8 weeks.   If your lab results are normal, I will send you a letter with the results. If abnormal, someone at the clinic will get in touch with you.   It was a pleasure to meet you.

## 2011-09-04 NOTE — Progress Notes (Signed)
SHx:    Lives with boyfriend and FOB Nanetta Batty) who is studying to be an Retail buyer. Also 2 dogs.   Patient works at [employer].    Denies history of smoking, no longer drinking alcohol after finding out she was pregnant (drank occasionally), denies other drug use.   A/P: G1 without any concerning risk factors. 1. Initial labs done at [facility] where she went due to some vaginal bleeding attributed to dislodgement of mucous.  2. Will do pap smear today. 3. Will perform 1 hour glucola today due to risk factors obesity & African-American.  4. Will schedule you for your anatomic ultrasound @ 18 weeks. 5. Follow-up in 1 month at [facility] clinic then with me in 2 months.  6. Weight gain goal is 11-20 pounds during pregnancy. Discussed continuing to exercise regularly and healthy, high-fiber diet with 8-10 glasses water daily.  7. PNV causes nausea/vomiting, so taking Gummi vitamins (without iron). Told her this would be okay as long as she is taking 0.6mg  of folic acid daily.

## 2011-09-06 ENCOUNTER — Encounter: Payer: Self-pay | Admitting: Family Medicine

## 2011-09-13 ENCOUNTER — Telehealth: Payer: Self-pay | Admitting: Family Medicine

## 2011-09-13 NOTE — Telephone Encounter (Signed)
Ms. Postlethwait has referral entered for U/S but doesn't see with whom are when.   Please contact with appt info.

## 2011-09-17 NOTE — Telephone Encounter (Signed)
Ultrasound scheduled for 9/27 at 2:30pm at [facility], patient informed.

## 2011-09-20 ENCOUNTER — Ambulatory Visit (INDEPENDENT_AMBULATORY_CARE_PROVIDER_SITE_OTHER): Payer: Medicaid Other | Admitting: Family Medicine

## 2011-09-20 VITALS — BP 132/80 | Temp 98.7°F | Wt 219.0 lb

## 2011-09-20 DIAGNOSIS — Z331 Pregnant state, incidental: Secondary | ICD-10-CM

## 2011-09-20 DIAGNOSIS — Z23 Encounter for immunization: Secondary | ICD-10-CM

## 2011-09-20 NOTE — Patient Instructions (Signed)
It was a pleasure to see you today in [facility]!  You are at 18 weeks and 1 day today!  You received a flu shot today.   I am glad you will bring your baby to the [facility]!  We are glad to be the doctors for your whole family.  Your anatomy scan ultrasound is scheduled at [facility] on Thursday, Sept 27 at 2:30pm; please keep this appointment.  Please make a follow up OB appointment here with Dr Madolyn Frieze in 4 weeks.

## 2011-09-20 NOTE — Progress Notes (Signed)
Patient seen in [facility].  No complaints.  Beginning to feel quickening. No headaches, nausea or vomiting.  Fundus palpable just above the umbilicus. Has US anatomy scan scheduled at [facility] for Thursday, Sept 27th.   Would be interested in perinatal education classes at [facility].  Would give her the Perinatal Education phone number [phone] or [phone] at next visit; she was discharged before we could give her these numbers.  Plans to bring infant to [facility] for routine care.  Follow up OB visit in 4 weeks.

## 2011-09-20 NOTE — Progress Notes (Signed)
Called patient at home to give the phone numbers to Perinatal Education office at [facility].  Also, patient reports that she was counseled about genetics screening earlier in the pregnancy and had declined, repeats that decision today. [name]

## 2011-09-27 ENCOUNTER — Ambulatory Visit (HOSPITAL_COMMUNITY)
Admission: RE | Admit: 2011-09-27 | Discharge: 2011-09-27 | Disposition: A | Discharge status: Home / Self Care | Payer: Medicaid Other | Source: Ambulatory Visit | Attending: Family Medicine | Admitting: Family Medicine

## 2011-09-27 DIAGNOSIS — Z1389 Encounter for screening for other disorder: Secondary | ICD-10-CM | POA: Insufficient documentation

## 2011-09-27 DIAGNOSIS — O358XX0 Maternal care for other (suspected) fetal abnormality and damage, not applicable or unspecified: Secondary | ICD-10-CM | POA: Insufficient documentation

## 2011-09-27 DIAGNOSIS — Z363 Encounter for antenatal screening for malformations: Secondary | ICD-10-CM | POA: Insufficient documentation

## 2011-09-27 LAB — POCT URINALYSIS DIP (DEVICE)
Glucose, UA: NEGATIVE
Nitrite: NEGATIVE
Operator id: 282151
Specific Gravity, Urine: 1.02
Urobilinogen, UA: >=8

## 2011-09-27 LAB — POCT PREGNANCY, URINE: Preg Test, Ur: NEGATIVE

## 2011-10-02 ENCOUNTER — Encounter: Payer: Medicaid Other | Admitting: Family Medicine

## 2011-10-02 ENCOUNTER — Other Ambulatory Visit: Payer: Self-pay | Admitting: Family Medicine

## 2011-10-02 DIAGNOSIS — R9389 Abnormal findings on diagnostic imaging of other specified body structures: Secondary | ICD-10-CM

## 2011-10-02 NOTE — Assessment & Plan Note (Signed)
Dr. Mauricio Po comments: while this may be incidental, may be associated with fetal growth restriction and maternal thrombophilic conditions, recommends close follow-up in 4 weeks. Would consider formal MFM or HROB consult.   Will schedule repeat ultrasound. Will put in order for referral to HR[facility] after the repeat ultrasound for a consultation visit.  Spoke with patient and discussed plan.

## 2011-10-03 ENCOUNTER — Emergency Department (HOSPITAL_COMMUNITY)
Admission: EM | Admit: 2011-10-03 | Discharge: 2011-10-04 | Discharge status: Against Medical Advice | Payer: BC Managed Care – PPO | Attending: Emergency Medicine | Admitting: Emergency Medicine

## 2011-10-03 DIAGNOSIS — Z0389 Encounter for observation for other suspected diseases and conditions ruled out: Secondary | ICD-10-CM | POA: Insufficient documentation

## 2011-10-08 ENCOUNTER — Other Ambulatory Visit: Payer: Self-pay | Admitting: Family Medicine

## 2011-10-08 DIAGNOSIS — Z1389 Encounter for screening for other disorder: Secondary | ICD-10-CM

## 2011-10-11 ENCOUNTER — Ambulatory Visit (HOSPITAL_COMMUNITY)
Admission: RE | Admit: 2011-10-11 | Discharge: 2011-10-11 | Disposition: A | Discharge status: Home / Self Care | Payer: Medicaid Other | Source: Ambulatory Visit | Attending: Family Medicine | Admitting: Family Medicine

## 2011-10-11 DIAGNOSIS — Z1389 Encounter for screening for other disorder: Secondary | ICD-10-CM

## 2011-10-11 DIAGNOSIS — Z3689 Encounter for other specified antenatal screening: Secondary | ICD-10-CM | POA: Insufficient documentation

## 2011-10-11 DIAGNOSIS — O43899 Other placental disorders, unspecified trimester: Secondary | ICD-10-CM | POA: Insufficient documentation

## 2011-10-12 NOTE — Assessment & Plan Note (Signed)
Documentation only. Repeat U/S on 10/11 shows no change in appearance of preplacental thrombus. Suggest follow-up in 2 weeks to assess for appropriate linear growth.  Faxed information to [facility] clinic to set-up appointment for this patient for evaluation regarding this issue. Before scheduling repeat ultrasound in 2 weeks, will wait and see what the providers there advise.

## 2011-10-26 ENCOUNTER — Ambulatory Visit (INDEPENDENT_AMBULATORY_CARE_PROVIDER_SITE_OTHER): Payer: Medicaid Other | Admitting: Family Medicine

## 2011-10-26 VITALS — BP 125/85 | Temp 98.0°F | Wt 220.0 lb

## 2011-10-26 DIAGNOSIS — Z3480 Encounter for supervision of other normal pregnancy, unspecified trimester: Secondary | ICD-10-CM

## 2011-10-26 DIAGNOSIS — Z3490 Encounter for supervision of normal pregnancy, unspecified, unspecified trimester: Secondary | ICD-10-CM

## 2011-10-26 DIAGNOSIS — R9389 Abnormal findings on diagnostic imaging of other specified body structures: Secondary | ICD-10-CM

## 2011-10-26 DIAGNOSIS — J45909 Unspecified asthma, uncomplicated: Secondary | ICD-10-CM

## 2011-10-26 MED ORDER — ALBUTEROL 90 MCG/ACT IN AERS: 2.0000 | INHALATION_SPRAY | Freq: Four times a day (QID) | RESPIRATORY_TRACT | Status: DC | PRN

## 2011-10-26 NOTE — Patient Instructions (Addendum)
It was so nice to see you today.  Jaci Carrel (I hope I spelled it right!) is a lovely name.  Please make an appointment to go to the [facility] Ob clinic.  We will let you know about your appointment for your repeat ultrasound.  Follow-up with me in 4 weeks.

## 2011-10-26 NOTE — Progress Notes (Signed)
Doing well. No complaints. Baby's name is [name].  1. Given pre-term labor precautions. 2. Patient missed HROB clinic due to not being able to make it at that time. Asked her to call back and re-schedule appointment to see them. 3. Will order recommended follow-up ultrasound to monitor preplacental mass. 4. Follow-up with me in 4 weeks.

## 2011-10-30 ENCOUNTER — Ambulatory Visit (HOSPITAL_COMMUNITY)
Admission: RE | Admit: 2011-10-30 | Discharge: 2011-10-30 | Disposition: A | Discharge status: Home / Self Care | Payer: Medicaid Other | Source: Ambulatory Visit | Attending: Family Medicine | Admitting: Family Medicine

## 2011-10-30 DIAGNOSIS — O43899 Other placental disorders, unspecified trimester: Secondary | ICD-10-CM | POA: Insufficient documentation

## 2011-10-30 DIAGNOSIS — R9389 Abnormal findings on diagnostic imaging of other specified body structures: Secondary | ICD-10-CM

## 2011-10-30 DIAGNOSIS — Z3689 Encounter for other specified antenatal screening: Secondary | ICD-10-CM | POA: Insufficient documentation

## 2011-11-01 ENCOUNTER — Telehealth: Payer: Self-pay | Admitting: Family Medicine

## 2011-11-01 NOTE — ED Provider Notes (Signed)
Agree with above note.  Tyshell Ramberg 11/01/2011 12:47 PM   

## 2011-11-01 NOTE — Progress Notes (Signed)
Update: follow-up ultrasound shows preplacental mass is unchanged in size but appears more disorganized. Spoke with patient who will make appointment with HR Ob for evaluation today.

## 2011-11-01 NOTE — Telephone Encounter (Signed)
Informed of ultrasound results. No change in size of preplacental mass but looks more disorganized. Patient will call [facility] for appointment today for evaluation regarding this.

## 2011-11-16 DIAGNOSIS — Z3490 Encounter for supervision of normal pregnancy, unspecified, unspecified trimester: Secondary | ICD-10-CM | POA: Insufficient documentation

## 2011-11-26 ENCOUNTER — Ambulatory Visit (INDEPENDENT_AMBULATORY_CARE_PROVIDER_SITE_OTHER): Payer: Medicaid Other | Admitting: Family Medicine

## 2011-11-26 VITALS — BP 129/76 | Temp 98.4°F | Wt 219.7 lb

## 2011-11-26 DIAGNOSIS — Z3480 Encounter for supervision of other normal pregnancy, unspecified trimester: Secondary | ICD-10-CM

## 2011-11-26 DIAGNOSIS — Z3490 Encounter for supervision of normal pregnancy, unspecified, unspecified trimester: Secondary | ICD-10-CM

## 2011-11-26 LAB — CBC
Hemoglobin: 11.6 g/dL — ABNORMAL LOW (ref 12.0–15.0)
MCHC: 33.3 g/dL (ref 30.0–36.0)
Platelets: 302 10*3/uL (ref 150–400)

## 2011-11-26 LAB — HIV ANTIBODY (ROUTINE TESTING W REFLEX): HIV: NONREACTIVE

## 2011-11-26 NOTE — Progress Notes (Signed)
This is a G1 @ 27 5/7. Doing well. FOB supportive. Just recently promoted to Production designer, theatre/television/film at [employer].  1. Some pain in the upper abdominal region occasionally. Does not seem to be GERD (not associated with eating, no acid taste). Lasts several minutes. Does not cause significant bother. Will monitor for now. 2. Will do 1-hour glucola at next visit. Patient declined today. Did early 1 hour glucola which was normal. 3. Will check CBC, RPR, HIV, type and screen today. 4. Follow-up at [facility] clinic in 2 weeks. Follow-up with me in 4 weeks.  5. Patient has follow-up ultrasound to monitor pre-placental mass on 12/11/2011. She still has not made an appointment to be evaluated at [facility]. She just got a promotion and has been busy. But says she will call and make an appointment today. Preplacental mass is not changing in size based on U/S end of October, but does appear more disorganized. 6. Will breast feed. 7. Contraception: considering Implanon, OCPs 8. Pediatrician: [facility]

## 2011-11-26 NOTE — Progress Notes (Signed)
Addended by: Jone Baseman D on: 11/26/2011 10:33 AM   Modules accepted: Orders

## 2011-11-26 NOTE — Patient Instructions (Addendum)
Follow-up at [facility] clinic in 2 weeks here.  Please call [facility] Ob clinic and schedule appointment today.  I am glad things are going well for you.  It was nice to see you today.

## 2011-11-27 LAB — RPR

## 2011-12-11 ENCOUNTER — Ambulatory Visit (HOSPITAL_COMMUNITY)
Admission: RE | Admit: 2011-12-11 | Discharge: 2011-12-11 | Disposition: A | Discharge status: Home / Self Care | Payer: Medicaid Other | Source: Ambulatory Visit | Attending: Family Medicine | Admitting: Family Medicine

## 2011-12-11 DIAGNOSIS — Z3689 Encounter for other specified antenatal screening: Secondary | ICD-10-CM | POA: Insufficient documentation

## 2011-12-11 DIAGNOSIS — O43899 Other placental disorders, unspecified trimester: Secondary | ICD-10-CM | POA: Insufficient documentation

## 2011-12-11 DIAGNOSIS — R9389 Abnormal findings on diagnostic imaging of other specified body structures: Secondary | ICD-10-CM

## 2011-12-13 ENCOUNTER — Ambulatory Visit (INDEPENDENT_AMBULATORY_CARE_PROVIDER_SITE_OTHER): Payer: Medicaid Other | Admitting: Family Medicine

## 2011-12-13 VITALS — BP 130/80 | Wt 219.5 lb

## 2011-12-13 DIAGNOSIS — Z3490 Encounter for supervision of normal pregnancy, unspecified, unspecified trimester: Secondary | ICD-10-CM

## 2011-12-13 DIAGNOSIS — Z3480 Encounter for supervision of other normal pregnancy, unspecified trimester: Secondary | ICD-10-CM

## 2011-12-13 DIAGNOSIS — Z23 Encounter for immunization: Secondary | ICD-10-CM

## 2011-12-13 NOTE — Patient Instructions (Addendum)
It was great meeting you today!  Follow up with Dr. Sharol Given in 2 weeks.  Dr. Sharol Given will schedule the ultrasound at your next visit.   SEEK MEDICAL CARE IF:   You develop a temperature of 100 F (37.8 C) or higher.   You have a bloody mucus discharge from the vagina (a bloody show).  SEEK IMMEDIATE MEDICAL CARE IF:   You do not feel the baby move.   You think the baby's movements are too little or too many.   You develop contractions.   You develop vaginal bleeding.   You have belly (abdominal) pain.   You have leaking or a gush of fluid from the vagina.  Document Released: 12/07/2002 Document Revised: 08/29/2011 Document Reviewed: 04/11/2009 [facility] Patient Information 2012 [place], [place].  Fetal Movement Counts Kick counts is highly recommended in high risk pregnancies, but it is a good idea for every pregnant woman to do. Start counting fetal movements at 28 weeks of the pregnancy. Fetal movements increase after eating a full meal or eating or drinking something sweet (the blood sugar is higher). It is also important to drink plenty of fluids (well hydrated) before doing the count. Lie on your left side because it helps with the circulation or you can sit in a comfortable chair with your arms over your belly (abdomen) with no distractions around you. DOING THE COUNT  Try to do the count the same time of day each time you do it.   Mark the day and time, then see how long it takes for you to feel 10 movements (kicks, flutters, swishes, rolls). You should have at least 10 movements within 2 hours. You will most likely feel 10 movements in much less than 2 hours. If you do not, wait an hour and count again. After a couple of days you will see a pattern.   What you are looking for is a change in the pattern or not enough counts in 2 hours. Is it taking longer in time to reach 10 movements?  SEEK MEDICAL CARE IF:  You feel less than 10 counts in 2 hours. Tried twice.   No  movement in one hour.   The pattern is changing or taking longer each day to reach 10 counts in 2 hours.   You feel the baby is not moving as it usually does.   [employer] Patient Information 2012 [place], [place].

## 2011-12-13 NOTE — Progress Notes (Signed)
G1 at 30.1wga who presents to [facility] clinic. Doing well. No complaints today. She continues working 50+ hr weeks and plans on cutting back in a couple of weeks because she has been feeling a little more tired. She has signed up for the birthing classes at [facility] and will start those in January. She doesn't plan on any anesthesia during labor for now. No worsening of her asthma.  Pediatrician: [facility] Plans on breastfeeding. Does not have a car seat yet.  Was told that the thrombus resolved on ultrasound and advised to get repeat US in 4-6 weeks to assess growth.  PE:  Pulm: Clear to auscultation bilaterally, no wheezing. CV: S1S2, RRR, no murmurs, rubs or gallops Fundal height measurement: 33cm. Epic flowsheet did not show previous fundal heights. Unable to assess interval measurement. Plan: 1. Repeat US in 4-6weeks per radiologist recs to assess growth. Since thrombus has resolved, no need to refer to high risk OB for now. Patient measuring slightly bigger than dates. Since previous fundal height measurements do not appear, it is difficult to assess interval growth since last visit. Repeat US in 4-6 weeks will help in determining growth pattern. Will ask Dr. Sharol Given to schedule at next visit. 2. 1 hr glucola today 3. Patient to receive Tdap today 4. Follow up in 2 weeks with Dr. Sharol Given

## 2011-12-14 ENCOUNTER — Inpatient Hospital Stay (HOSPITAL_COMMUNITY)
Admission: AD | Admit: 2011-12-14 | Discharge: 2011-12-25 | DRG: 372 | Disposition: A | Discharge status: Home / Self Care | Payer: BC Managed Care – PPO | Source: Ambulatory Visit | Attending: Obstetrics & Gynecology | Admitting: Obstetrics & Gynecology

## 2011-12-14 DIAGNOSIS — O42919 Preterm premature rupture of membranes, unspecified as to length of time between rupture and onset of labor, unspecified trimester: Secondary | ICD-10-CM

## 2011-12-14 DIAGNOSIS — O411090 Infection of amniotic sac and membranes, unspecified, unspecified trimester, not applicable or unspecified: Secondary | ICD-10-CM

## 2011-12-14 DIAGNOSIS — O3660X0 Maternal care for excessive fetal growth, unspecified trimester, not applicable or unspecified: Secondary | ICD-10-CM | POA: Diagnosis present

## 2011-12-14 DIAGNOSIS — O4290 Premature rupture of membranes, unspecified as to length of time between rupture and onset of labor, unspecified weeks of gestation: Secondary | ICD-10-CM | POA: Diagnosis present

## 2011-12-14 DIAGNOSIS — O411290 Chorioamnionitis, unspecified trimester, not applicable or unspecified: Secondary | ICD-10-CM

## 2011-12-14 NOTE — Progress Notes (Signed)
Pt states, "About 30 min ago, I woke up and thought I had peed on myself. I got up and it kept running out, and it is still have little trickles."

## 2011-12-15 ENCOUNTER — Inpatient Hospital Stay (HOSPITAL_COMMUNITY): Payer: BC Managed Care – PPO

## 2011-12-15 ENCOUNTER — Encounter (HOSPITAL_COMMUNITY): Payer: Self-pay | Admitting: Obstetrics and Gynecology

## 2011-12-15 DIAGNOSIS — O4290 Premature rupture of membranes, unspecified as to length of time between rupture and onset of labor, unspecified weeks of gestation: Secondary | ICD-10-CM

## 2011-12-15 LAB — CBC
HCT: 35.3 % — ABNORMAL LOW (ref 36.0–46.0)
MCH: 28.5 pg (ref 26.0–34.0)
MCHC: 33.7 g/dL (ref 30.0–36.0)
MCV: 84.7 fL (ref 78.0–100.0)
Platelets: 273 10*3/uL (ref 150–400)
RDW: 14.4 % (ref 11.5–15.5)
WBC: 9.4 10*3/uL (ref 4.0–10.5)

## 2011-12-15 LAB — URINALYSIS, ROUTINE W REFLEX MICROSCOPIC
Bilirubin Urine: NEGATIVE
Hgb urine dipstick: NEGATIVE
Ketones, ur: NEGATIVE mg/dL
Protein, ur: NEGATIVE mg/dL
Urobilinogen, UA: 0.2 mg/dL (ref 0.0–1.0)

## 2011-12-15 LAB — RPR: RPR Ser Ql: NONREACTIVE

## 2011-12-15 LAB — WET PREP, GENITAL: Yeast Wet Prep HPF POC: NONE SEEN

## 2011-12-15 MED ORDER — CALCIUM CARBONATE ANTACID 500 MG PO CHEW
2.0000 | CHEWABLE_TABLET | ORAL | Status: DC | PRN
  Filled 2011-12-15: qty 1

## 2011-12-15 MED ORDER — ERYTHROMYCIN BASE 250 MG PO TABS
250.0000 mg | ORAL_TABLET | Freq: Four times a day (QID) | ORAL | Status: AC
  Administered 2011-12-17 – 2011-12-21 (×20): 250 mg via ORAL
  Filled 2011-12-15 (×21): qty 1

## 2011-12-15 MED ORDER — PRENATAL PLUS 27-1 MG PO TABS
1.0000 | ORAL_TABLET | Freq: Every day | ORAL | Status: DC
  Administered 2011-12-15 – 2011-12-23 (×9): 1 via ORAL
  Filled 2011-12-15 (×9): qty 1

## 2011-12-15 MED ORDER — SODIUM CHLORIDE 0.9 % IV SOLN
250.0000 mg | Freq: Four times a day (QID) | INTRAVENOUS | Status: AC
  Administered 2011-12-15 – 2011-12-16 (×8): 250 mg via INTRAVENOUS
  Filled 2011-12-15 (×8): qty 250

## 2011-12-15 MED ORDER — DOCUSATE SODIUM 100 MG PO CAPS
100.0000 mg | ORAL_CAPSULE | Freq: Every day | ORAL | Status: DC
  Administered 2011-12-15 – 2011-12-23 (×9): 100 mg via ORAL
  Filled 2011-12-15 (×9): qty 1

## 2011-12-15 MED ORDER — SODIUM CHLORIDE 0.9 % IV SOLN
2.0000 g | Freq: Four times a day (QID) | INTRAVENOUS | Status: AC
  Administered 2011-12-15 – 2011-12-16 (×9): 2 g via INTRAVENOUS
  Filled 2011-12-15 (×9): qty 2000

## 2011-12-15 MED ORDER — BETAMETHASONE SOD PHOS & ACET 6 (3-3) MG/ML IJ SUSP
12.0000 mg | Freq: Every day | INTRAMUSCULAR | Status: AC
  Administered 2011-12-15 – 2011-12-16 (×2): 12 mg via INTRAMUSCULAR
  Filled 2011-12-15 (×2): qty 2

## 2011-12-15 MED ORDER — PRENATAL PLUS 27-1 MG PO TABS: 1.0000 | ORAL_TABLET | Freq: Every day | ORAL | Status: DC

## 2011-12-15 MED ORDER — ZOLPIDEM TARTRATE 10 MG PO TABS: 10.0000 mg | ORAL_TABLET | Freq: Every evening | ORAL | Status: DC | PRN

## 2011-12-15 MED ORDER — ACETAMINOPHEN 325 MG PO TABS
650.0000 mg | ORAL_TABLET | ORAL | Status: DC | PRN
  Administered 2011-12-23: 650 mg via ORAL
  Filled 2011-12-15: qty 2

## 2011-12-15 MED ORDER — LACTATED RINGERS IV SOLN
INTRAVENOUS | Status: DC
  Administered 2011-12-15: 12:00:00 via INTRAVENOUS
  Administered 2011-12-15: 125 mL/h via INTRAVENOUS
  Administered 2011-12-16 – 2011-12-23 (×4): via INTRAVENOUS

## 2011-12-15 NOTE — H&P (Signed)
  Please see MAU note for complete H&P.

## 2011-12-15 NOTE — Progress Notes (Signed)
FACULTY PRACTICE ANTEPARTUM(COMPREHENSIVE) NOTE  [name] is a 20 y.o. G1P0 at [redacted]w[redacted]d by LMP who is admitted for PPROM.   Fetal presentation is cephalic. Length of Stay:  1  Days  Subjective: Leaking small amounts of fluid Patient reports the fetal movement as active. Patient reports uterine contraction  activity as none. Patient reports  vaginal bleeding as none. Patient describes fluid per vagina as Clear.  Vitals:  Blood pressure 118/60, pulse 89, temperature 98.6 F (37 C), temperature source Oral, resp. rate 20, height 5' 2.25" (1.581 m), weight 98.034 kg (216 lb 2 oz), last menstrual period 05/16/2011, SpO2 97.00%. Physical Examination:  General appearance - alert, well appearing, and in no distress Heart - normal rate and regular rhythm Abdomen - soft, nontender, nondistended Cervical Exam: 2.6 cms long per u/s Extremities: extremities normal, atraumatic, no cyanosis or edema and Homans sign is negative, no sign of DVT with DTRs 2+ bilaterally Membranes:ruptured, clear fluid; AFT 6.66 cms  Fetal Monitoring:  Baseline: 140 bpm, Variability: Good {> 6 bpm), Accelerations: Reactive and Decelerations: Absent  Labs:  Recent Results (from the past 24 hour(s))  URINALYSIS, ROUTINE W REFLEX MICROSCOPIC   Collection Time   12/14/11 11:42 PM      Component Value Range   Color, Urine YELLOW  YELLOW    APPearance CLEAR  CLEAR    Specific Gravity, Urine 1.010  1.005 - 1.030    pH 6.0  5.0 - 8.0    Glucose, UA NEGATIVE  NEGATIVE (mg/dL)   Hgb urine dipstick NEGATIVE  NEGATIVE    Bilirubin Urine NEGATIVE  NEGATIVE    Ketones, ur NEGATIVE  NEGATIVE (mg/dL)   Protein, ur NEGATIVE  NEGATIVE (mg/dL)   Urobilinogen, UA 0.2  0.0 - 1.0 (mg/dL)   Nitrite NEGATIVE  NEGATIVE    Leukocytes, UA NEGATIVE  NEGATIVE   CBC   Collection Time   12/15/11  1:15 AM      Component Value Range   WBC 9.4  4.0 - 10.5 (K/uL)   RBC 4.17  3.87 - 5.11 (MIL/uL)   Hemoglobin 11.9 (*) 12.0 - 15.0  (g/dL)   HCT 16.1 (*) 09.6 - 46.0 (%)   MCV 84.7  78.0 - 100.0 (fL)   MCH 28.5  26.0 - 34.0 (pg)   MCHC 33.7  30.0 - 36.0 (g/dL)   RDW 04.5  40.9 - 81.1 (%)   Platelets 273  150 - 400 (K/uL)  RPR   Collection Time   12/15/11  1:15 AM      Component Value Range   RPR NON REACTIVE  NON REACTIVE   WET PREP, GENITAL   Collection Time   12/15/11  1:16 AM      Component Value Range   Yeast, Wet Prep NONE SEEN  NONE SEEN    Trich, Wet Prep NONE SEEN  NONE SEEN    Clue Cells, Wet Prep NONE SEEN  NONE SEEN    WBC, Wet Prep HPF POC RARE (*) NONE SEEN     Imaging Studies:     US Ob Limited  12/15/2011  OBSTETRICAL ULTRASOUND: This exam was performed within a [facility]. The OB US report was generated in the AS system, and faxed to the ordering physician.   This report is also available in [employer] and in the [employer]. See [facility] Obstetric US report.    Medications:  Scheduled    . ampicillin (OMNIPEN) IV  2 g Intravenous Q6H  .  betamethasone acetate-betamethasone sodium phosphate  12 mg Intramuscular Daily  . docusate sodium  100 mg Oral Daily  . erythromycin  250 mg Intravenous Q6H   Followed by  . erythromycin  250 mg Oral Q6H  . prenatal vitamin w/FE, FA  1 tablet Oral Daily  . DISCONTD: prenatal vitamin w/FE, FA  1 tablet Oral Daily   I have reviewed the patient's current medications.  ASSESSMENT: Patient Active Problem List  Diagnoses  . MIGRAINE, UNSPEC., W/O INTRACTABLE MIGRAINE  . RHINITIS, ALLERGIC  . ASTHMA, UNSPECIFIED  . ECZEMA  . HIDRADENITIS SUPPURATIVA  . Abnormal finding on ultrasound  . Pregnancy, supervision of normal  . Large for gestational age (LGA)    PLAN: Expectant management  CRESENZO-DISHMAN,Lyall Faciane 12/15/2011,8:33 PM

## 2011-12-15 NOTE — Progress Notes (Signed)
"  At about 2230, I was awakened from my sleep with a big gush of fluid.  I thought I peed on myself, but the fluid continues to trickle out of me.  (+) FM.  No bleeding or cramping/contractions."

## 2011-12-15 NOTE — ED Provider Notes (Cosign Needed Addendum)
History     Chief Complaint  Patient presents with  . Rupture of Membranes   HPI  Patient presents to MAU @ 30.[redacted] weeks gestation for SROM.  Patient felt a gush of fluid today around 10:30 PM.  Patient is seen at [facility].  She denies any vaginal bleeding, pain/pressure, or contractions.  Endorses good fetal movement.     OB History    Grav Para Term Preterm Abortions TAB SAB Ect Mult Living   1               Past Medical History  Diagnosis Date  . Asthma     Past Surgical History  Procedure Date  . Cholecystectomy     Family History  Problem Relation Age of Onset  . Anesthesia problems Neg Hx   . Hypotension Neg Hx   . Malignant hyperthermia Neg Hx   . Pseudochol deficiency Neg Hx     History  Substance Use Topics  . Smoking status: Never Smoker   . Smokeless tobacco: Never Used  . Alcohol Use: No    Allergies: No Known Allergies  Prescriptions prior to admission  Medication Sig Dispense Refill  . albuterol (PROVENTIL,VENTOLIN) 90 MCG/ACT inhaler Inhale 2 puffs into the lungs every 6 (six) hours as needed for wheezing.  17 g  12  . OVER THE COUNTER MEDICATION Take 1 capsule by mouth daily. Over the counter gummy prenatal vitamin       . prenatal vitamin w/FE, FA (PRENATAL 1 + 1) 27-1 MG TABS Take 1 tablet by mouth daily.          ROS  Denies fever, chills, night sweats, nausea or vomiting.  Physical Exam   Blood pressure 127/78, pulse 98, temperature 99.2 F (37.3 C), temperature source Oral, resp. rate 20, height 5' 2.25" (1.581 m), weight 98.034 kg (216 lb 2 oz), last menstrual period 05/16/2011, SpO2 97.00%.  Physical Exam  Constitutional: No distress.  HENT:  Mouth/Throat: Oropharynx is clear and moist.  Neck: Normal range of motion. Neck supple.  Cardiovascular: Normal rate, regular rhythm, normal heart sounds and intact distal pulses.  Exam reveals no gallop and no friction rub.   No murmur heard. Respiratory: Effort normal  and breath sounds normal. She has no wheezes. She has no rales.  GI:       Gravid, non-tender  Genitourinary:       SE: positive pooling, + ferning, + discharge, no vaginal bleeding  Musculoskeletal: She exhibits no edema and no tenderness.  Neurological: She is alert.  Skin: Skin is warm and dry.     MAU Course  Procedures GBS, fern test, GC/chlamydia, wet prep  MDM Positive fern test.  Discussed case with Dorathy Kinsman, CNM.  See A&P.  Assessment and Plan  1) PPROM: will admit to antenatal and start antibiotics to increase latency period.  Will start Betamethasone per protocol.  Will obtain GBS, wet prep, and GC/chlamydia.   2) GBS: results pending 3) Disposition: admit to antepartum unit, will inform PCP in am  DE LA CRUZ,IVY 12/15/2011, 12:52 AM

## 2011-12-16 DIAGNOSIS — O4290 Premature rupture of membranes, unspecified as to length of time between rupture and onset of labor, unspecified weeks of gestation: Secondary | ICD-10-CM

## 2011-12-16 LAB — URINE CULTURE

## 2011-12-16 MED ORDER — AMOXICILLIN 500 MG PO CAPS
500.0000 mg | ORAL_CAPSULE | Freq: Three times a day (TID) | ORAL | Status: AC
  Administered 2011-12-17 – 2011-12-21 (×15): 500 mg via ORAL
  Filled 2011-12-16 (×15): qty 1

## 2011-12-16 NOTE — Plan of Care (Signed)
Problem: Consults Goal: Birthing Suites Patient Information Press F2 to bring up selections list   Other (specify with a note) Pt is antenatal patient with pprom on 12/14.

## 2011-12-16 NOTE — Progress Notes (Signed)
Patient ID: [name], female   DOB: 23-May-1991, 20 y.o.   MRN: [id number] FACULTY PRACTICE ANTEPARTUM(COMPREHENSIVE) NOTE  [name] is a 49 y.o. G1P0 at [redacted]w[redacted]d by LMP who is admitted for PPROM.   Fetal presentation is cephalic. Length of Stay:  2  Days  Subjective: Leaking small amounts of fluid Patient reports the fetal movement as active. Patient reports uterine contraction  activity as none. Patient reports  vaginal bleeding as none. Patient describes fluid per vagina as Clear.  Vitals:  Blood pressure 121/63, pulse 87, temperature 98.5 F (36.9 C), temperature source Oral, resp. rate 20, height 5' 2.25" (1.581 m), weight 98.034 kg (216 lb 2 oz), last menstrual period 05/16/2011, SpO2 97.00%. Physical Examination:  General appearance - alert, well appearing, and in no distress Heart - normal rate and regular rhythm Abdomen - soft, nontender, nondistended Cervical Exam: 2.6 cms long per u/s Extremities: extremities normal, atraumatic, no cyanosis or edema and Homans sign is negative, no sign of DVT with DTRs 2+ bilaterally Membranes:ruptured, clear fluid; AFT 6.66 cms  Fetal Monitoring:  Baseline: 140 bpm, Variability: Good {> 6 bpm), Accelerations: Reactive and Decelerations: Absent  Labs:  No results found for this or any previous visit (from the past 24 hour(s)).  Imaging Studies:     Vertex, AFI 6  Medications:  Scheduled    . ampicillin (OMNIPEN) IV  2 g Intravenous Q6H  . betamethasone acetate-betamethasone sodium phosphate  12 mg Intramuscular Daily  . docusate sodium  100 mg Oral Daily  . erythromycin  250 mg Intravenous Q6H   Followed by  . erythromycin  250 mg Oral Q6H  . prenatal vitamin w/FE, FA  1 tablet Oral Daily   I have reviewed the patient's current medications.  ASSESSMENT: 20yo G1P0 at [redacted]w[redacted]d with PPROM  PLAN: Fetal status reassuring Continue monitoring for si/sx of chorioamnionitis Continue antibiotics to prolong latency  period Continue close observatipon  Srikar Chiang 12/16/2011,7:02 AM

## 2011-12-17 MED ORDER — UAC/UVC NICU FLUSH (1/4 NS + HEPARIN 0.5 UNIT/ML): 0.5000 mL | INJECTION | INTRAVENOUS | Status: DC | PRN

## 2011-12-17 MED ORDER — SODIUM CHLORIDE 0.9 % IJ SOLN
3.0000 mL | Freq: Two times a day (BID) | INTRAMUSCULAR | Status: DC
  Administered 2011-12-17 – 2011-12-23 (×14): 3 mL via INTRAVENOUS

## 2011-12-17 NOTE — Progress Notes (Signed)
Patient ID: [name], female   DOB: 1991/01/20, 20 y.o.   MRN: [id number] FACULTY PRACTICE ANTEPARTUM(COMPREHENSIVE) NOTE  [name] is a 20 y.o. G1P0 at [redacted]w[redacted]d by LMP who is admitted for PROM.   Fetal presentation is cephalic. Length of Stay:  3  Days  Subjective: No Complaints Patient reports the fetal movement as active. Patient reports uterine contraction  activity as none. Patient reports  vaginal bleeding as none. Patient describes fluid per vagina as Clear.  Vitals:  Blood pressure 116/47, pulse 78, temperature 98.2 F (36.8 C), temperature source Oral, resp. rate 18, height 5' 2.25" (1.581 m), weight 216 lb 2 oz (98.034 kg), last menstrual period 05/16/2011, SpO2 97.00%. Physical Examination:  General appearance - alert, well appearing, and in no distress, oriented to person, place, and time and overweight Mental status - alert, oriented to person, place, and time, normal mood, behavior, speech, dress, motor activity, and thought processes, affect appropriate to mood Fundal Height:  size equals dates and non-tender Pelvic Exam:  examination not indicated Extremities: extremities normal, atraumatic, no cyanosis or edema with DTRs 1+ bilaterally Membranes:ruptured, clear fluid  Fetal Monitoring:  Baseline: 125 bpm, Variability: Good {> 6 bpm) and Accelerations: Reactive  Labs:  No results found for this or any previous visit (from the past 24 hour(s)).  Medications:  Scheduled . amoxicillin  500 mg Oral Q8H  . docusate sodium  100 mg Oral Daily  . erythromycin  250 mg Oral Q6H  . prenatal vitamin w/FE, FA  1 tablet Oral Daily    I have reviewed the patient's current medications.  ASSESSMENT: PPROM 30.5 Patient Active Problem List  Diagnoses  . MIGRAINE, UNSPEC., W/O INTRACTABLE MIGRAINE  . RHINITIS, ALLERGIC  . ASTHMA, UNSPECIFIED  . ECZEMA  . HIDRADENITIS SUPPURATIVA  . Abnormal finding on ultrasound  . Pregnancy, supervision of normal  . Large for  gestational age (LGA)    PLAN: Continue current management PO ABX x 5 days total Saline lock IV Fetal Monitoring BID  Tayshon Winker E. 12/17/2011,6:26 AM

## 2011-12-17 NOTE — Progress Notes (Signed)
UR chart review completed.  

## 2011-12-17 NOTE — Progress Notes (Signed)
Monitors off for intermittent monitoring

## 2011-12-18 LAB — GC/CHLAMYDIA PROBE AMP, GENITAL
Chlamydia, DNA Probe: NEGATIVE
GC Probe Amp, Genital: NEGATIVE

## 2011-12-18 NOTE — ED Provider Notes (Signed)
I was present for the exam and agree with the above.  Katrinka Blazing, Jalexus Brett 12/18/2011 2:42 AM

## 2011-12-18 NOTE — H&P (Signed)
I agree with the above.  Katrinka Blazing, Darneshia Demary 12/18/2011 2:42 AM

## 2011-12-18 NOTE — Progress Notes (Signed)
Patient ID: [name], female   DOB: 20-Jan-1991, 20 y.o.   MRN: [id number] FACULTY PRACTICE ANTEPARTUM(COMPREHENSIVE) NOTE  [name] is a 20 y.o. G1P0 at [redacted]w[redacted]d by best clinical estimate who is admitted for rupture of membranes.   Fetal presentation is cephalic. Length of Stay:  4  Days  Subjective: No complaints Patient reports the fetal movement as active. Patient reports uterine contraction  activity as none. Patient reports  vaginal bleeding as none. Patient describes fluid per vagina as None.  Vitals:  Blood pressure 117/60, pulse 91, temperature 98 F (36.7 C), temperature source Oral, resp. rate 16, height 5' 2.25" (1.581 m), weight 216 lb 2 oz (98.034 kg), last menstrual period 05/16/2011, SpO2 97.00%. Physical Examination:  General appearance - alert, well appearing, and in no distress Heart - normal rate and regular rhythm Abdomen - soft, nontender, nondistended Fundal Height:  size equals dates Cervical Exam: Not evaluated. and found to be not evaluated/ not done/Floating and fetal presentation is cephalic. Extremities: extremities normal, atraumatic, no cyanosis or edema and Homans sign is negative, no sign of DVT with DTRs 2+ bilaterally Membranes ruptured, clear fluid  Fetal Monitoring:  Baseline: 140 bpm, Variability: Good {> 6 bpm) and Accelerations: Reactive  Labs:  No results found for this or any previous visit (from the past 24 hour(s)).  Imaging Studies:    [place] Currently EPIC will not allow sonographic studies to automatically populate into notes.  In the meantime, copy and paste results into note or free text.  Medications:  Scheduled    . amoxicillin  500 mg Oral Q8H  . docusate sodium  100 mg Oral Daily  . erythromycin  250 mg Oral Q6H  . prenatal vitamin w/FE, FA  1 tablet Oral Daily  . sodium chloride  3 mL Intravenous Q12H   I have reviewed the patient's current medications.  ASSESSMENT: Patient Active Problem List  Diagnoses  .  MIGRAINE, UNSPEC., W/O INTRACTABLE MIGRAINE  . RHINITIS, ALLERGIC  . ASTHMA, UNSPECIFIED  . ECZEMA  . HIDRADENITIS SUPPURATIVA  . Abnormal finding on ultrasound  . Pregnancy, supervision of normal  . Large for gestational age (LGA)    PLAN: Continue present management, monitor for PTL, chorioamnionitis  Owen Pratte 12/18/2011,7:46 AM

## 2011-12-19 LAB — CULTURE, BETA STREP (GROUP B ONLY)

## 2011-12-19 NOTE — Progress Notes (Signed)
Patient ID: [name], female   DOB: December 26, 1991, 20 y.o.   MRN: [id number] FACULTY PRACTICE ANTEPARTUM(COMPREHENSIVE) NOTE  [name] is a 20 y.o. G1P0 at [redacted]w[redacted]d Fetal presentation is cephalic.   Subjective: Denies pain, bldg or reg ctx; has occ leaking of clear fluid  Vitals:  Blood pressure 118/52, pulse 83, temperature 98.4 F (36.9 C), temperature source Oral, resp. rate 18, height 5' 2.25" (1.581 m), weight 98.034 kg (216 lb 2 oz), last menstrual period 05/16/2011, SpO2 97.00%. Physical Examination:  General appearance - alert, well appearing, and in no distress Cervical Exam: Not evaluated. Extremities: extremities normal, atraumatic, no cyanosis or edema  Fetal Monitoring:  Baseline: 140 bpm on NST yesterday; pending for today   Medications:  Scheduled    . amoxicillin  500 mg Oral Q8H  . docusate sodium  100 mg Oral Daily  . erythromycin  250 mg Oral Q6H  . prenatal vitamin w/FE, FA  1 tablet Oral Daily  . sodium chloride  3 mL Intravenous Q12H   I have reviewed the patient's current medications.  ASSESSMENT: Patient Active Problem List  Diagnoses  . MIGRAINE, UNSPEC., W/O INTRACTABLE MIGRAINE  . RHINITIS, ALLERGIC  . ASTHMA, UNSPECIFIED  . ECZEMA  . HIDRADENITIS SUPPURATIVA  . Abnormal finding on ultrasound  . Pregnancy, supervision of normal  . Large for gestational age (LGA)    PLAN: IUP at 80 wks PPROM- stable  Continue current care  SHAW, KIMBERLY 12/19/2011,7:15 AM

## 2011-12-20 DIAGNOSIS — O411290 Chorioamnionitis, unspecified trimester, not applicable or unspecified: Secondary | ICD-10-CM

## 2011-12-20 DIAGNOSIS — O42919 Preterm premature rupture of membranes, unspecified as to length of time between rupture and onset of labor, unspecified trimester: Secondary | ICD-10-CM

## 2011-12-20 NOTE — Progress Notes (Signed)
Patient ID: [name], female   DOB: [birth year], 20 y.o.   MRN: [id number]  FACULTY PRACTICE ANTEPARTUM NOTE  [name] is a 74 y.o. G1P0 at [redacted]w[redacted]d  who is admitted for PPROM.   Fetal presentation is cephalic. Length of Stay:  6  Days  Subjective:  Patient reports good fetal movement.  She reports no uterine contractions, no bleeding.  Still leaking fluid per vagina.  Vitals:  Blood pressure 129/56, pulse 91, temperature 98.2 F (36.8 C), temperature source Oral, resp. rate 18, height 5' 2.25" (1.581 m), weight 99.655 kg (219 lb 11.2 oz), last menstrual period 05/16/2011, SpO2 97.00%. Physical Examination:  General appearance - alert, well appearing, and in no distress Chest - clear to auscultation, no wheezes, rales or rhonchi, symmetric air entry Heart - normal rate, regular rhythm, normal S1, S2, no murmurs, rubs, clicks or gallops Abdomen - soft, nontender, nondistended, no masses or organomegaly Fundal Height:  size equals dates Extremities: extremities normal, atraumatic, no cyanosis or edema  Membranes:ruptured  Fetal Monitoring:  Baseline: 150s bpm, Variability: Good {> 6 bpm), Accelerations: Reactive and Decelerations: Absent  Labs:  No results found for this or any previous visit (from the past 24 hour(s)).  Medications:  Scheduled    . amoxicillin  500 mg Oral Q8H  . docusate sodium  100 mg Oral Daily  . erythromycin  250 mg Oral Q6H  . prenatal vitamin w/FE, FA  1 tablet Oral Daily  . sodium chloride  3 mL Intravenous Q12H   I have reviewed the patient's current medications.  ASSESSMENT: Patient Active Problem List  Diagnoses  . MIGRAINE, UNSPEC., W/O INTRACTABLE MIGRAINE  . RHINITIS, ALLERGIC  . ASTHMA, UNSPECIFIED  . ECZEMA  . HIDRADENITIS SUPPURATIVA  . Abnormal finding on ultrasound  . Pregnancy, supervision of normal  . Large for gestational age (LGA)  . Preterm premature rupture of membranes (PPROM) delivered, current hospitalization    . Chorioamnionitis    PLAN: Continue antibiotics for suspected chorioamnionitis and PPROM - To end 12/22.   Continue routine antenatal care. Continue NST BID.  Marks, Stephanie JEHIEL 12/20/2011,7:39 AM

## 2011-12-20 NOTE — Consult Note (Signed)
Neonatology Consult to Antenatal Patient:  Ms. Lecuyer was admitted 12/14 after PPROM, now for 6 days at 73 1/[redacted] weeks GA. She is currently not having active labor. She is getting Ampicillin and Erythromycin and has gotten 2 doses of BMZ 12/14-15. The plan is for induction at 34 weeks unless delivery is indicated sooner. The fetus is female.  I spoke with the patient and the father of the baby. We discussed the worst case of delivery in the next 1-2 days, including usual DR management, possible respiratory complications and need for support, IV access, feedings (mother desires breast feeding), LOS, Mortality and Morbidity, and long term outcomes. She did not have any questions at this time. I offered a NICU tour to any interested family members and would be glad to come back if she has more questions later.  Thank you for asking me to see this patient.  Deatra James, MD Neonatologist  Time spent: [phone]

## 2011-12-20 NOTE — Progress Notes (Signed)
UR Chart review completed.  

## 2011-12-20 NOTE — Progress Notes (Signed)
Admitted at  30.[redacted] weeks gestation, now at 31.1 weeks, with PROM.  Height  62.25"  Weight 219 Lbs  pre-pregnancy weight 219 Lbs.Pre-pregnancy  BMI 39 ( obese)  IBW 110 Lbs  Total weight gain none. Weight gain goals 11-22 Lbs.   Estimated needs: 18-2000 kcal/day, 68-78  grams protein/day, 2 liters fluid/day Regular  diet tolerated well, appetite good. Pt reports no Hx of N/V Current diet prescription will provide for increased needs. No abnormal nutrition related labs  Diet order changed to antenatal regular to allow snacks requested by pt.  Nutrition Dx: Increased nutrient needs r/t pregnancy and fetal growth requirements aeb [redacted] weeks gestation.  No educational needs assessed at this time.

## 2011-12-21 NOTE — Progress Notes (Signed)
FACULTY PRACTICE ANTEPARTUM(COMPREHENSIVE) NOTE  [name] is a 20 y.o. G1P0 at [redacted]w[redacted]d  who is admitted for PPROM.   Fetal presentation is cephalic. Length of Stay:  7  Days  Subjective: Leaking small amounts of fluid Patient reports the fetal movement as active. Patient reports uterine contraction  activity as none. Patient reports  vaginal bleeding as none. Patient describes fluid per vagina as Clear.  Vitals:  Blood pressure 122/47, pulse 86, temperature 98.3 F (36.8 C), temperature source Oral, resp. rate 20, height 5' 2.25" (1.581 m), weight 219 lb 11.2 oz (99.655 kg), last menstrual period 05/16/2011, SpO2 97.00%. Physical Examination:  General appearance - alert, well appearing, and in no distress Heart - normal rate and regular rhythm Abdomen - soft, nontender, nondistended Fundal Height:  size equals dates Cervical Exam: Not evaluated.  Extremities: extremities normal, atraumatic, no cyanosis or edema and Homans sign is negative, no sign of DVT with DTRs 2+ bilaterally   Fetal Monitoring:  last NST reactive without decels  Labs:  No results found for this or any previous visit (from the past 24 hour(s)).  Imaging Studies:      Medications:  Scheduled    . amoxicillin  500 mg Oral Q8H  . docusate sodium  100 mg Oral Daily  . erythromycin  250 mg Oral Q6H  . prenatal vitamin w/FE, FA  1 tablet Oral Daily  . sodium chloride  3 mL Intravenous Q12H   I have reviewed the patient's current medications.  ASSESSMENT: Patient Active Problem List  Diagnoses  . MIGRAINE, UNSPEC., W/O INTRACTABLE MIGRAINE  . RHINITIS, ALLERGIC  . ASTHMA, UNSPECIFIED  . ECZEMA  . HIDRADENITIS SUPPURATIVA  . Abnormal finding on ultrasound  . Pregnancy, supervision of normal  . Large for gestational age (LGA)  . Preterm premature rupture of membranes (PPROM) delivered, current hospitalization  . Chorioamnionitis    PLAN: Continue present management; Plan IOL @ 34  weeks  CRESENZO-DISHMAN,Patience Nuzzo 12/21/2011,7:36 AM

## 2011-12-22 DIAGNOSIS — O411090 Infection of amniotic sac and membranes, unspecified, unspecified trimester, not applicable or unspecified: Secondary | ICD-10-CM

## 2011-12-22 NOTE — Progress Notes (Signed)
Patient ID: [name], female   DOB: 16-Apr-1991, 20 y.o.   MRN: [id number] FACULTY PRACTICE ANTEPARTUM(COMPREHENSIVE) NOTE  [name] is a 53 y.o. G1P0 at [redacted]w[redacted]d  who is admitted for PPROM.   Fetal presentation is cephalic. Length of Stay:  8  Days  Subjective: Leaking small amounts of fluid Patient reports the fetal movement as active. Patient reports uterine contraction  activity as none. Patient reports  vaginal bleeding as none. Patient describes fluid per vagina as Clear.  Vitals:  Blood pressure 117/54, pulse 102, temperature 98.9 F (37.2 C), temperature source Oral, resp. rate 18, height 5' 2.25" (1.581 m), weight 99.655 kg (219 lb 11.2 oz), last menstrual period 05/16/2011, SpO2 97.00%. Physical Examination:  General appearance - alert, well appearing, and in no distress Heart - normal rate and regular rhythm Abdomen - soft, nontender, nondistended Fundal Height:  size equals dates Cervical Exam: Not evaluated.  Extremities: extremities normal, atraumatic, no cyanosis or edema and Homans sign is negative, no sign of DVT with DTRs 2+ bilaterally   Fetal Monitoring:  last NST reactive without decels  Labs:  No results found for this or any previous visit (from the past 24 hour(s)).  Imaging Studies:      Medications:  Scheduled    . amoxicillin  500 mg Oral Q8H  . docusate sodium  100 mg Oral Daily  . erythromycin  250 mg Oral Q6H  . prenatal vitamin w/FE, FA  1 tablet Oral Daily  . sodium chloride  3 mL Intravenous Q12H   I have reviewed the patient's current medications.  ASSESSMENT: Patient Active Problem List  Diagnoses  . MIGRAINE, UNSPEC., W/O INTRACTABLE MIGRAINE  . RHINITIS, ALLERGIC  . ASTHMA, UNSPECIFIED  . ECZEMA  . HIDRADENITIS SUPPURATIVA  . Abnormal finding on ultrasound  . Pregnancy, supervision of normal  . Large for gestational age (LGA)  . Preterm premature rupture of membranes (PPROM) delivered, current hospitalization  .  Chorioamnionitis    PLAN: 21 yo G1P0 @ [redacted]w[redacted]d with PPROM - No si/sx of chorioamnionitis - Continue present management; -  Plan IOL @ 34 weeks  [name] 12/22/2011,8:41 AM

## 2011-12-22 NOTE — Progress Notes (Signed)
When RN asked pt. "How often are you wearing your SCDs?" Pt. States, "I only wear them at night." Pt. Educated on purpose of SCD - verbalized understanding.

## 2011-12-22 NOTE — Progress Notes (Signed)
Pt. Sitting on side of bed with FOB on couch.

## 2011-12-23 ENCOUNTER — Encounter (HOSPITAL_COMMUNITY): Payer: Self-pay | Admitting: Anesthesiology

## 2011-12-23 ENCOUNTER — Encounter (HOSPITAL_COMMUNITY): Payer: Self-pay | Admitting: *Deleted

## 2011-12-23 ENCOUNTER — Inpatient Hospital Stay (HOSPITAL_COMMUNITY): Payer: BC Managed Care – PPO | Admitting: Anesthesiology

## 2011-12-23 LAB — CBC
Hemoglobin: 12.4 g/dL (ref 12.0–15.0)
Platelets: 326 10*3/uL (ref 150–400)
RBC: 4.34 MIL/uL (ref 3.87–5.11)

## 2011-12-23 MED ORDER — LACTATED RINGERS IV SOLN: 500.0000 mL | Freq: Once | INTRAVENOUS | Status: DC

## 2011-12-23 MED ORDER — DIPHENHYDRAMINE HCL 50 MG/ML IJ SOLN: 12.5000 mg | INTRAMUSCULAR | Status: DC | PRN

## 2011-12-23 MED ORDER — FENTANYL 2.5 MCG/ML BUPIVACAINE 1/10 % EPIDURAL INFUSION (WH - ANES)
14.0000 mL/h | INTRAMUSCULAR | Status: DC
  Administered 2011-12-24: 14 mL/h via EPIDURAL
  Filled 2011-12-23 (×2): qty 60

## 2011-12-23 MED ORDER — IBUPROFEN 600 MG PO TABS: 600.0000 mg | ORAL_TABLET | Freq: Four times a day (QID) | ORAL | Status: DC | PRN

## 2011-12-23 MED ORDER — PHENYLEPHRINE 40 MCG/ML (10ML) SYRINGE FOR IV PUSH (FOR BLOOD PRESSURE SUPPORT)
80.0000 ug | PREFILLED_SYRINGE | INTRAVENOUS | Status: DC | PRN
  Filled 2011-12-23: qty 5

## 2011-12-23 MED ORDER — EPHEDRINE 5 MG/ML INJ: 10.0000 mg | INTRAVENOUS | Status: DC | PRN

## 2011-12-23 MED ORDER — OXYTOCIN BOLUS FROM INFUSION: 500.0000 mL | Freq: Once | INTRAVENOUS | Status: DC

## 2011-12-23 MED ORDER — LIDOCAINE HCL (PF) 1 % IJ SOLN: 30.0000 mL | INTRAMUSCULAR | Status: DC | PRN

## 2011-12-23 MED ORDER — TERBUTALINE SULFATE 1 MG/ML IJ SOLN: 0.2500 mg | Freq: Once | INTRAMUSCULAR | Status: AC | PRN

## 2011-12-23 MED ORDER — FENTANYL 2.5 MCG/ML BUPIVACAINE 1/10 % EPIDURAL INFUSION (WH - ANES)
INTRAMUSCULAR | Status: DC | PRN
  Administered 2011-12-23: 14 mL/h via EPIDURAL

## 2011-12-23 MED ORDER — ACETAMINOPHEN 325 MG PO TABS: 650.0000 mg | ORAL_TABLET | ORAL | Status: DC | PRN

## 2011-12-23 MED ORDER — LACTATED RINGERS IV SOLN: INTRAVENOUS | Status: DC

## 2011-12-23 MED ORDER — CITRIC ACID-SODIUM CITRATE 334-500 MG/5ML PO SOLN: 30.0000 mL | ORAL | Status: DC | PRN

## 2011-12-23 MED ORDER — EPHEDRINE 5 MG/ML INJ
10.0000 mg | INTRAVENOUS | Status: DC | PRN
  Filled 2011-12-23: qty 4

## 2011-12-23 MED ORDER — NALBUPHINE HCL 10 MG/ML IJ SOLN
10.0000 mg | INTRAMUSCULAR | Status: DC | PRN
  Filled 2011-12-23: qty 1

## 2011-12-23 MED ORDER — PHENYLEPHRINE 40 MCG/ML (10ML) SYRINGE FOR IV PUSH (FOR BLOOD PRESSURE SUPPORT): 80.0000 ug | PREFILLED_SYRINGE | INTRAVENOUS | Status: DC | PRN

## 2011-12-23 MED ORDER — GENTAMICIN SULFATE 40 MG/ML IJ SOLN
150.0000 mg | Freq: Three times a day (TID) | INTRAVENOUS | Status: DC
  Filled 2011-12-23 (×2): qty 3.75

## 2011-12-23 MED ORDER — OXYTOCIN 20 UNITS IN LACTATED RINGERS INFUSION - SIMPLE
1.0000 m[IU]/min | INTRAVENOUS | Status: DC
  Administered 2011-12-24: 333 m[IU]/min via INTRAVENOUS
  Filled 2011-12-23: qty 1000

## 2011-12-23 MED ORDER — GENTAMICIN SULFATE 40 MG/ML IJ SOLN
170.0000 mg | Freq: Once | INTRAVENOUS | Status: AC
  Administered 2011-12-23: 170 mg via INTRAVENOUS
  Filled 2011-12-23: qty 4.25

## 2011-12-23 MED ORDER — FLEET ENEMA 7-19 GM/118ML RE ENEM: 1.0000 | ENEMA | RECTAL | Status: DC | PRN

## 2011-12-23 MED ORDER — LACTATED RINGERS IV SOLN: 500.0000 mL | INTRAVENOUS | Status: DC | PRN

## 2011-12-23 MED ORDER — LIDOCAINE HCL 1.5 % IJ SOLN
INTRAMUSCULAR | Status: DC | PRN
  Administered 2011-12-23 (×2): 4 mL via EPIDURAL

## 2011-12-23 MED ORDER — SODIUM CHLORIDE 0.9 % IV SOLN
1.0000 g | Freq: Four times a day (QID) | INTRAVENOUS | Status: DC
  Filled 2011-12-23: qty 1000

## 2011-12-23 MED ORDER — SODIUM CHLORIDE 0.9 % IV SOLN
1.0000 g | Freq: Four times a day (QID) | INTRAVENOUS | Status: DC
  Administered 2011-12-23: 1 g via INTRAVENOUS
  Filled 2011-12-23 (×3): qty 1000

## 2011-12-23 MED ORDER — ONDANSETRON HCL 4 MG/2ML IJ SOLN: 4.0000 mg | Freq: Four times a day (QID) | INTRAMUSCULAR | Status: DC | PRN

## 2011-12-23 MED ORDER — NALBUPHINE SYRINGE 5 MG/0.5 ML
10.0000 mg | INJECTION | INTRAMUSCULAR | Status: DC | PRN
  Administered 2011-12-23: 10 mg via INTRAVENOUS
  Filled 2011-12-23 (×2): qty 1

## 2011-12-23 MED ORDER — OXYTOCIN 20 UNITS IN LACTATED RINGERS INFUSION - SIMPLE: 125.0000 mL/h | Freq: Once | INTRAVENOUS | Status: DC

## 2011-12-23 NOTE — Progress Notes (Signed)
Patient ID: [name], female   DOB: 1991-01-15, 20 y.o.   MRN: [id number]  I was called by the nurses to evaluate the patient complaint of pelvic/abdominal pain.    During the day I have been following the FH tracing due to an increased number of variables compared to previous days.  Excellent variability has been present at all times.  A bedside ultrasound now shows a singleton with a vertex presentation.  Her uterus is tender, especially at the suprapubic area.  She is not currently febrile but her WBC is now 11.  I have diagnosed her with chorioamnionitis complicating her PPROM at 31.[redacted] weeks EGA and I have recommended induction of labor. She and her FOB understand and all questions were answered. I will treat her with amp and gent.

## 2011-12-23 NOTE — Anesthesia Procedure Notes (Signed)
Epidural Patient location during procedure: OB Start time: 12/23/2011 11:35 PM  Staffing Anesthesiologist: Zeena Starkel A. Performed by: anesthesiologist   Preanesthetic Checklist Completed: patient identified, site marked, surgical consent, pre-op evaluation, timeout performed, IV checked, risks and benefits discussed and monitors and equipment checked  Epidural Patient position: sitting Prep: site prepped and draped and DuraPrep Patient monitoring: continuous pulse ox and blood pressure Approach: midline Injection technique: LOR air  Needle:  Needle type: Tuohy  Needle gauge: 17 G Needle length: 9 cm Needle insertion depth: 6 cm Catheter type: closed end flexible Catheter size: 19 Gauge Catheter at skin depth: 11 cm Test dose: negative and 1.5% lidocaine  Assessment Events: blood not aspirated, injection not painful, no injection resistance, negative IV test and no paresthesia  Additional Notes Patient is more comfortable after epidural dosed. Please see RN's note for documentation of vital signs and FHR which are stable.

## 2011-12-23 NOTE — Progress Notes (Signed)
Pt refused to have saline lock restarted. CNM Lawson notified.

## 2011-12-23 NOTE — Progress Notes (Signed)
Patient ID: [name], female   DOB: 22-May-1991, 20 y.o.   MRN: [id number] Patient ID: [name], female   DOB: 22-May-1991, 20 y.o.   MRN: [id number] FACULTY PRACTICE ANTEPARTUM(COMPREHENSIVE) NOTE  [name] is a 80 y.o. G1P0 at [redacted]w[redacted]d  who is admitted for PPROM.   Fetal presentation is cephalic. Length of Stay:  9  Days  Subjective: Leaking small amounts of fluid Patient reports the fetal movement as active. Patient reports uterine contraction  activity as none. Patient reports  vaginal bleeding as none. Patient describes fluid per vagina as Clear.  Vitals:  Blood pressure 128/70, pulse 94, temperature 98.4 F (36.9 C), temperature source Oral, resp. rate 18, height 5' 2.25" (1.581 m), weight 99.655 kg (219 lb 11.2 oz), last menstrual period 05/16/2011, SpO2 97.00%. Physical Examination:  General appearance - alert, well appearing, and in no distress Heart - normal rate and regular rhythm Abdomen - soft, nontender, nondistended Fundal Height:  size equals dates Cervical Exam: Not evaluated.  Extremities: extremities normal, atraumatic, no cyanosis or edema and Homans sign is negative, no sign of DVT with DTRs 2+ bilaterally   Fetal Monitoring:  Baseline 140, mod variability, pos accles no decels. Tracing Category I. Toco no contractions  Labs:  No results found for this or any previous visit (from the past 24 hour(s)).  Imaging Studies:      Medications:  Scheduled    . docusate sodium  100 mg Oral Daily  . prenatal vitamin w/FE, FA  1 tablet Oral Daily  . sodium chloride  3 mL Intravenous Q12H   I have reviewed the patient's current medications.  ASSESSMENT: Patient Active Problem List  Diagnoses  . MIGRAINE, UNSPEC., W/O INTRACTABLE MIGRAINE  . RHINITIS, ALLERGIC  . ASTHMA, UNSPECIFIED  . ECZEMA  . HIDRADENITIS SUPPURATIVA  . Abnormal finding on ultrasound  . Pregnancy, supervision of normal  . Large for gestational age (LGA)  . Preterm  premature rupture of membranes (PPROM) delivered, current hospitalization  . Chorioamnionitis    PLAN: 20 yo G1P0 @ [redacted]w[redacted]d with PPROM - No si/sx of chorioamnionitis - Continue present management; -  Plan IOL @ 34 weeks  [name] 12/23/2011,6:23 AM

## 2011-12-23 NOTE — Anesthesia Preprocedure Evaluation (Signed)
Anesthesia Evaluation  Patient identified by MRN, date of birth, ID band Patient awake    Reviewed: Allergy & Precautions, H&P , Patient's Chart, lab work & pertinent test results  Airway Mallampati: III TM Distance: >3 FB Neck ROM: full    Dental No notable dental hx. (+) Teeth Intact   Pulmonary asthma ,  clear to auscultation  Pulmonary exam normal       Cardiovascular neg cardio ROS regular Normal    Neuro/Psych  Headaches, Negative Psych ROS   GI/Hepatic negative GI ROS, Neg liver ROS,   Endo/Other  Morbid obesity  Renal/GU negative Renal ROS  Genitourinary negative   Musculoskeletal   Abdominal Normal abdominal exam  (+)   Peds  Hematology negative hematology ROS (+)   Anesthesia Other Findings   Reproductive/Obstetrics (+) Pregnancy                           Anesthesia Physical Anesthesia Plan  ASA: III  Anesthesia Plan: Epidural   Post-op Pain Management:    Induction:   Airway Management Planned:   Additional Equipment:   Intra-op Plan:   Post-operative Plan:   Informed Consent: I have reviewed the patients History and Physical, chart, labs and discussed the procedure including the risks, benefits and alternatives for the proposed anesthesia with the patient or authorized representative who has indicated his/her understanding and acceptance.     Plan Discussed with: Anesthesiologist and Surgeon  Anesthesia Plan Comments:         Anesthesia Quick Evaluation

## 2011-12-23 NOTE — Progress Notes (Signed)
ANTIBIOTIC CONSULT NOTE - INITIAL  Pharmacy Consult for Gentamicin Indication: PROM/Chorioamnionitis  No Known Allergies  Patient Measurements: Height: 5' 2.25" (158.1 cm) Weight: 219 lb 11.2 oz (99.655 kg) IBW/kg (Calculated) : 50.68  Adjusted Body Weight: = 65kg  Vital Signs: Temp: 98.7 F (37.1 C) (12/23 1935) Temp src: Oral (12/23 1935) BP: 129/64 mmHg (12/23 1935) Pulse Rate: 104  (12/23 1935)      Labs:  Basename 12/23/11 1800  WBC 11.1*  HGB 12.4  PLT 326  LABCREA --  CREATININE --   CrCl is unknown because no creatinine reading has been taken.   Microbiology: Recent Results (from the past 720 hour(s))  URINE CULTURE     Status: Normal   Collection Time   12/15/11  1:16 AM      Component Value Range Status Comment   Specimen Description URINE, CLEAN CATCH   Final    Special Requests NONE   Final    Setup Time [id number]   Final    Colony Count 15,000 COLONIES/ML   Final    Culture     Final    Value: Multiple bacterial morphotypes present, none predominant. Suggest appropriate recollection if clinically indicated.   Report Status 12/16/2011 FINAL   Final   WET PREP, GENITAL     Status: Abnormal   Collection Time   12/15/11  1:16 AM      Component Value Range Status Comment   Yeast, Wet Prep NONE SEEN  NONE SEEN  Final    Trich, Wet Prep NONE SEEN  NONE SEEN  Final    Clue Cells, Wet Prep NONE SEEN  NONE SEEN  Final    WBC, Wet Prep HPF POC RARE (*) NONE SEEN  Final FEW BACTERIA SEEN  CULTURE, BETA STREP (GROUP B ONLY)     Status: Normal   Collection Time   12/15/11  1:16 AM      Component Value Range Status Comment   Specimen Description VAGINAL/RECTAL   Final    Special Requests NONE   Final    Culture GROUP B STREP(S.AGALACTIAE)ISOLATED   Final    Report Status 12/19/2011 FINAL   Final    Organism ID, Bacteria GROUP B STREP(S.AGALACTIAE)ISOLATED   Final     Medical History: Past Medical History  Diagnosis Date  . Asthma      Medications: Also on Ampicillin 1 gm iv Q6 hrs.  Assessment: PROM/ Chorioamnionitis  Goal of Therapy: Peak ~ 6.5-7mg /L,  Trough ~ < 1mg /L  Plan:  Load Gentamicin 170mg  IV. 8 hrs after load, begin Gent 150mg  IV Q 8 hr. Check Serum Creatinine if continued and ss levels as needed.  Hovey-Rankin, Cristin Szatkowski 12/23/2011,9:58 PM

## 2011-12-24 ENCOUNTER — Encounter (HOSPITAL_COMMUNITY): Payer: Self-pay | Admitting: *Deleted

## 2011-12-24 DIAGNOSIS — O4290 Premature rupture of membranes, unspecified as to length of time between rupture and onset of labor, unspecified weeks of gestation: Secondary | ICD-10-CM

## 2011-12-24 DIAGNOSIS — O411090 Infection of amniotic sac and membranes, unspecified, unspecified trimester, not applicable or unspecified: Secondary | ICD-10-CM

## 2011-12-24 MED ORDER — OXYCODONE-ACETAMINOPHEN 5-325 MG PO TABS: 1.0000 | ORAL_TABLET | ORAL | Status: DC | PRN

## 2011-12-24 MED ORDER — DIBUCAINE 1 % RE OINT: 1.0000 "application " | TOPICAL_OINTMENT | RECTAL | Status: DC | PRN

## 2011-12-24 MED ORDER — ONDANSETRON HCL 4 MG PO TABS: 4.0000 mg | ORAL_TABLET | ORAL | Status: DC | PRN

## 2011-12-24 MED ORDER — PRENATAL MULTIVITAMIN CH
1.0000 | ORAL_TABLET | Freq: Every day | ORAL | Status: DC
  Administered 2011-12-24 – 2011-12-25 (×2): 1 via ORAL
  Filled 2011-12-24 (×2): qty 1

## 2011-12-24 MED ORDER — LANOLIN HYDROUS EX OINT: TOPICAL_OINTMENT | CUTANEOUS | Status: DC | PRN

## 2011-12-24 MED ORDER — DIPHENHYDRAMINE HCL 25 MG PO CAPS: 25.0000 mg | ORAL_CAPSULE | Freq: Four times a day (QID) | ORAL | Status: DC | PRN

## 2011-12-24 MED ORDER — TETANUS-DIPHTH-ACELL PERTUSSIS 5-2.5-18.5 LF-MCG/0.5 IM SUSP
0.5000 mL | Freq: Once | INTRAMUSCULAR | Status: DC
  Filled 2011-12-24: qty 0.5

## 2011-12-24 MED ORDER — ONDANSETRON HCL 4 MG/2ML IJ SOLN: 4.0000 mg | INTRAMUSCULAR | Status: DC | PRN

## 2011-12-24 MED ORDER — WITCH HAZEL-GLYCERIN EX PADS: 1.0000 "application " | MEDICATED_PAD | CUTANEOUS | Status: DC | PRN

## 2011-12-24 MED ORDER — IBUPROFEN 600 MG PO TABS
600.0000 mg | ORAL_TABLET | Freq: Four times a day (QID) | ORAL | Status: DC
  Administered 2011-12-24 – 2011-12-25 (×6): 600 mg via ORAL
  Filled 2011-12-24 (×6): qty 1

## 2011-12-24 MED ORDER — ZOLPIDEM TARTRATE 5 MG PO TABS: 5.0000 mg | ORAL_TABLET | Freq: Every evening | ORAL | Status: DC | PRN

## 2011-12-24 MED ORDER — SIMETHICONE 80 MG PO CHEW: 80.0000 mg | CHEWABLE_TABLET | ORAL | Status: DC | PRN

## 2011-12-24 MED ORDER — BENZOCAINE-MENTHOL 20-0.5 % EX AERO: 1.0000 "application " | INHALATION_SPRAY | CUTANEOUS | Status: DC | PRN

## 2011-12-24 MED ORDER — SENNOSIDES-DOCUSATE SODIUM 8.6-50 MG PO TABS
2.0000 | ORAL_TABLET | Freq: Every day | ORAL | Status: DC
  Administered 2011-12-24: 2 via ORAL

## 2011-12-24 MED ORDER — BENZOCAINE-MENTHOL 20-0.5 % EX AERO
INHALATION_SPRAY | CUTANEOUS | Status: AC
  Administered 2011-12-24: 07:00:00
  Filled 2011-12-24: qty 56

## 2011-12-24 NOTE — Anesthesia Postprocedure Evaluation (Signed)
  Anesthesia Post-op Note  Patient: [name]  Procedure(s) Performed: * Lumbar Epidural for L&D*  Patient Location: PACU and Women's Unit  Anesthesia Type: Epidural  Level of Consciousness: awake, alert  and oriented  Airway and Oxygen Therapy: Patient Spontanous Breathing  Post-op Pain: none  Post-op Assessment: Post-op Vital signs reviewed, Patient's Cardiovascular Status Stable, Respiratory Function Stable, Patent Airway, No signs of Nausea or vomiting, Adequate PO intake, Pain level controlled, No headache, No backache, No residual numbness and No residual motor weakness  Post-op Vital Signs: Reviewed and stable  Complications: No apparent anesthesia complications

## 2011-12-24 NOTE — Progress Notes (Signed)
UR chart review completed.  

## 2011-12-24 NOTE — Progress Notes (Signed)
[name] is a 20 y.o. G1P0 at [redacted]w[redacted]d previously admitted to antenatal on 12/15 for pprom on 12/14.  Now in L&D secondary to development of chorioamnionitis and labor.    Subjective: Doing well, comfortable with epidural. Reports pelvic/rectal pressure  Objective: BP 127/63   Pulse 103   Temp(Src) 98.8 F (37.1 C) (Oral)   Resp 18   Ht 5' 2.25" (1.581 m)   Wt 99.655 kg (219 lb 11.2 oz)   BMI 39.86 kg/m2   SpO2 99%   LMP 05/16/2011      FHT:  FHR: 140 bpm, variability: minimal/moderate,  accelerations:  Present,  decelerations:  Present occasional variables UC:   regular, every 2-4 minutes SVE:   Dilation: 9 Effacement (%): 100 Station: 0;+1 Exam by:: [facility] RN  Labs: Lab Results  Component Value Date   WBC 11.1* 12/23/2011   HGB 12.4 12/23/2011   HCT 36.7 12/23/2011   MCV 84.6 12/23/2011   PLT 326 12/23/2011    Assessment / Plan: Spontaneous labor, progressing normally On IV Amp/Gent for chorioamnionitis Afebrile  Labor: Progressing normally Preeclampsia:  n/a Fetal Wellbeing:  Category II Pain Control:  Epidural I/D:  n/a Anticipated MOD:  NSVD  Neonatologist has just recently spoken w/ pt & family.    To call RN w/ increased pelvic/rectal pressure  Joellyn Haff, SNM 12/24/2011, 1:00 AM

## 2011-12-25 MED ORDER — IBUPROFEN 600 MG PO TABS: 600.0000 mg | ORAL_TABLET | Freq: Four times a day (QID) | ORAL | Status: AC

## 2011-12-25 MED ORDER — PRENATAL MULTIVITAMIN CH: 1.0000 | ORAL_TABLET | Freq: Every day | ORAL | Status: DC

## 2011-12-25 NOTE — Anesthesia Postprocedure Evaluation (Signed)
  Anesthesia Post-op Note  Patient: [name]  Procedure(s) Performed: * No procedures listed *  Patient Location: [facility] Unit  Anesthesia Type: Epidural  Level of Consciousness: awake  Airway and Oxygen Therapy: Patient Spontanous Breathing  Post-op Pain: none  Post-op Assessment: Patient's Cardiovascular Status Stable and Respiratory Function Stable  Post-op Vital Signs: Reviewed and stable  Complications: No apparent anesthesia complications

## 2011-12-25 NOTE — Addendum Note (Signed)
Addendum  created 12/25/11 0813 by Suella Grove   Modules edited:Charges VN, Notes Section

## 2011-12-25 NOTE — Progress Notes (Signed)
Post Partum Day 1 Subjective: no complaints, up ad lib, voiding, tolerating PO and + flatus  Objective: Blood pressure 123/81, pulse 96, temperature 98.7 F (37.1 C), temperature source Oral, resp. rate 20, height 5' 2.25" (1.581 m), weight 99.338 kg (219 lb), last menstrual period 05/16/2011, SpO2 97.00%, unknown if currently breastfeeding. Pumping breasts and visiting NICU. Baby doing well.   Physical Exam:  General: alert and cooperative Lochia: appropriate Uterine Fundus: firm Incision: N/A.  DVT Evaluation: No evidence of DVT seen on physical exam.   Basename 12/23/11 1800  HGB 12.4  HCT 36.7    Assessment/Plan: Discharge home, Breastfeeding and Contraception Nexplanon   LOS: 11 days   [name] 12/25/2011, 3:24 PM

## 2011-12-25 NOTE — Discharge Summary (Signed)
Obstetric Discharge Summary  DOA: 12/15/11 DOD: 12/25/11  Reason for Admission: rupture of membranes Prenatal Procedures: NST and ultrasound Intrapartum Procedures: spontaneous vaginal delivery Postpartum Procedures: none Complications-Operative and Postpartum: 1st degree perineal laceration Hemoglobin  Date Value Range Status  12/23/2011 12.4  12.0-15.0 (g/dL) Final     HCT  Date Value Range Status  12/23/2011 36.7  36.0-46.0 (%) Final   Hospital Course: She was admitted for observation, antibiotics and MgSO4 CP prophylaxis. On 12/23/11 she developed lower abd pain and was noted to have more numerous variable decelerations. Her WBC count was 11.1 and she was afebrile. Decision was made to proceed with IOL due to chorioamnionitis, treated with Amp and Gent and progressed well to NSVD.  Consults:  Lactation consult  Discharge Diagnoses:  PPPROM  Chorioamnionitis NSVD at [redacted]w[redacted]d  Discharge Information: Date: 12/25/2011 Activity: per instruction sheet Diet: routine Medications: PNV and Ibuprofen Condition: stable Instructions: refer to practice specific booklet Reviewed breast pumping Discharge to: home Follow-up Information    Follow up with [facility] PARK, ANGELA, MD. Make an appointment in 6 weeks.   Contact information:   [address] [place] [address] [phone]          Newborn Data: Live born female  Birth Weight:  APGAR: 6, 8  Home with NICU. Mother lives 10 min from hospital and will visit often.   [name] 12/25/2011, 3:02 PM

## 2011-12-25 NOTE — Consult Note (Signed)
Reinforced regular pumping to establish a milk supply for her baby in the NICU.  Mom states she has gotten a small amount of colostrum to take to NICU, praise for this given to [facility].  Mom is taking home a loaner Lactina breast pump until she can obtain one from [facility].  Mom has printed information at bedside.  Reviewed collection, storage and transporting of breast milk with [facility].  Offered her to call our office PRN for any questions, or concerns after being discharged. Mom aware of LC in the NICU during the week.   To call us PRN

## 2011-12-25 NOTE — Progress Notes (Signed)
Pt discharged to home with father.  Condition stable.  Pt ambulated to car with Inis Sizer, RN.  No equipment ordered for home at discharge.  Pt home with loaner breast pump until she gets one from [facility].

## 2011-12-27 ENCOUNTER — Encounter: Payer: Medicaid Other | Admitting: Family Medicine

## 2012-01-14 ENCOUNTER — Ambulatory Visit: Payer: BC Managed Care – PPO | Admitting: Family Medicine

## 2012-01-22 ENCOUNTER — Ambulatory Visit: Payer: Self-pay | Admitting: Family Medicine

## 2012-01-22 ENCOUNTER — Ambulatory Visit (HOSPITAL_COMMUNITY): Payer: Medicaid Other

## 2012-01-23 ENCOUNTER — Ambulatory Visit (HOSPITAL_COMMUNITY)
Admission: RE | Admit: 2012-01-23 | Discharge: 2012-01-23 | Disposition: A | Discharge status: Home / Self Care | Payer: BC Managed Care – PPO | Source: Ambulatory Visit | Attending: Family Medicine | Admitting: Family Medicine

## 2012-01-23 NOTE — Progress Notes (Signed)
Infant Lactation Consultation Outpatient Visit Note  Patient Name: JILLANE PO Date of Birth: 1991-10-11 Birth Weight: 3-10   Gestational Age at Delivery: 31 Akila Batta Type of Delivery:   Breastfeeding History Frequency of Breastfeeding: Mom has been mostly pumping and bottle feeding Length of Feeding:  Voids:  Stools:   Supplementing / Method: Pumping:  Type of Pump:Lactina   Frequency:q 3 hours  Volume:  4-6 oz  Comments:    Consultation Evaluation:  Initial Feeding Assessment: Pre-feed Weight:4 lbs 12.6 oz 2172 g Post-feed Weight: 4 lbs 13.7    2204g Amount Transferred: 32cc Comments:Baby nursed with 16 NS for 20 minutes. Breast softer after nursing  Additional Feeding Assessment: Pre-feed Weight: Post-feed Weight: Amount Transferred: Comments:  Additional Feeding Assessment: Pre-feed Weight: Post-feed Weight: Amount Transferred: Comments:  Total Breast milk Transferred this Visit:  Total Supplement Given:   Additional Interventions: Baby would not take bottle of EBM after nursing To try BF 1-2 times per day and gradually increase number of BF/day. Continue pumping to maintain milk supply Mom states this is the best the baby has done at the breast. Follow-Up To see ped tomorrow. Suggested BFSG to follow up on weight and for BF support. Mom wants to call for next OP appointment.     Pamelia Hoit 01/23/2012, 1:40 PM

## 2012-01-31 ENCOUNTER — Ambulatory Visit: Payer: Self-pay | Admitting: Family Medicine

## 2012-02-08 ENCOUNTER — Ambulatory Visit: Payer: Medicaid Other | Admitting: Family Medicine

## 2012-02-08 ENCOUNTER — Ambulatory Visit (INDEPENDENT_AMBULATORY_CARE_PROVIDER_SITE_OTHER): Payer: Medicaid Other | Admitting: Family Medicine

## 2012-02-08 ENCOUNTER — Encounter: Payer: Self-pay | Admitting: Family Medicine

## 2012-02-08 VITALS — BP 138/81 | HR 70 | Temp 98.3°F | Ht 62.5 in | Wt 216.4 lb

## 2012-02-08 DIAGNOSIS — R21 Rash and other nonspecific skin eruption: Secondary | ICD-10-CM

## 2012-02-08 DIAGNOSIS — Z3490 Encounter for supervision of normal pregnancy, unspecified, unspecified trimester: Secondary | ICD-10-CM

## 2012-02-08 DIAGNOSIS — Z3480 Encounter for supervision of other normal pregnancy, unspecified trimester: Secondary | ICD-10-CM

## 2012-02-08 NOTE — Progress Notes (Signed)
  Subjective:     [name] is a 21 y.o. female who presents for a postpartum visit. She delivered about 6 weeks ago. Term delivery. Vaginal delivery.  No complaints today. Breast feeding exclusively without problem. No more vaginal bleeding.  Interested in Nexplanon for contraception  ROS: per HPI  2. Rash on upper lip started 3 days ago Does not hurt or itch Denies using new soaps, lotions, make-up Denies eating anything unusual recently  Objective:    BP 138/81  Pulse 70  Temp(Src) 98.3 F (36.8 C) (Oral)  Ht 5' 2.5" (1.588 m)  Wt 216 lb 6.4 oz (98.158 kg)  BMI 38.95 kg/m2  General:  alert, appears stated age and no distress   Breasts:  inspection negative, no nipple discharge or bleeding, no masses or nodularity palpable  Lungs: clear to auscultation bilaterally  Heart:  regular rate and rhythm, S1, S2 normal, no murmur, click, rub or gallop  Abdomen: soft, non-tender; bowel sounds normal; no masses,  no organomegaly   Vulva:  not evaluated  Vagina: not evaluated  Cervix:    Corpus: not examined  Adnexa:    Rectal Exam:        Skin: flat area of hyperpigmentation above upper lip and about 5 mm high; no redness, scales Assessment:    6-week postpartum exam.   Plan:    Contraception: asked patient to schedule for Nexpalnon

## 2012-02-08 NOTE — Patient Instructions (Signed)
You may stop taking your prenatal vitamin.   Please make an appointment to get the Nexplanon placed.  If you schedule it after February 21st, you may schedule it with me. If before, please schedule with Dr. Hulen Luster, Lula Olszewski, or Katrinka Blazing.  If your rash does not get better in the next 2 weeks, please follow-up with me and we can refer you to a skin doctor.   Congratulations on your new baby. Follow-up with me in 1 year for your next physical or if you have any concerns.

## 2012-02-08 NOTE — Assessment & Plan Note (Signed)
New. Started 3 days ago. Appears to be contact based on distribution but no clear source. Do not think related to pregnancy since just started a few days ago and she is 6 weeks post-partum. Recommend monitoring for 1-2 weeks. If worse, follow-up. Will refer to dermatology.

## 2012-02-21 ENCOUNTER — Encounter: Payer: Self-pay | Admitting: Family Medicine

## 2012-02-21 ENCOUNTER — Encounter (INDEPENDENT_AMBULATORY_CARE_PROVIDER_SITE_OTHER): Payer: Medicaid Other | Admitting: Family Medicine

## 2012-02-21 DIAGNOSIS — Z309 Encounter for contraceptive management, unspecified: Secondary | ICD-10-CM

## 2012-02-22 ENCOUNTER — Ambulatory Visit: Payer: Medicaid Other | Admitting: Family Medicine

## 2012-02-22 NOTE — Progress Notes (Signed)
This encounter was created in error - please disregard.

## 2012-02-25 ENCOUNTER — Encounter: Payer: Medicaid Other | Admitting: Family Medicine

## 2012-02-26 NOTE — Progress Notes (Signed)
This encounter was created in error - please disregard.

## 2012-03-14 ENCOUNTER — Ambulatory Visit: Payer: Medicaid Other | Admitting: Family Medicine

## 2012-03-17 NOTE — Progress Notes (Signed)
Addended by: Madolyn Frieze, Marylene Land J on: 03/17/2012 02:23 PM   Modules accepted: Level of Service

## 2012-03-17 NOTE — Assessment & Plan Note (Deleted)
Post-partum visit. Doing well. Would lik e

## 2012-04-17 ENCOUNTER — Encounter: Payer: Self-pay | Admitting: Family Medicine

## 2012-04-17 ENCOUNTER — Ambulatory Visit (INDEPENDENT_AMBULATORY_CARE_PROVIDER_SITE_OTHER): Payer: Medicaid Other | Admitting: Family Medicine

## 2012-04-17 DIAGNOSIS — Z309 Encounter for contraceptive management, unspecified: Secondary | ICD-10-CM

## 2012-04-17 DIAGNOSIS — Z30017 Encounter for initial prescription of implantable subdermal contraceptive: Secondary | ICD-10-CM

## 2012-04-17 DIAGNOSIS — Z3046 Encounter for surveillance of implantable subdermal contraceptive: Secondary | ICD-10-CM

## 2012-04-17 LAB — POCT URINE PREGNANCY: Preg Test, Ur: NEGATIVE

## 2012-04-17 MED ORDER — ETONOGESTREL 68 MG ~~LOC~~ IMPL
68.0000 mg | DRUG_IMPLANT | Freq: Once | SUBCUTANEOUS | Status: AC
  Administered 2012-04-17: 68 mg via SUBCUTANEOUS

## 2012-04-17 NOTE — Patient Instructions (Signed)
It was nice to see you today [name].  I am glad you and your family are doing well.   Keep the area clean and dry and covered with a band-aid at least for the next 24 hours or until it stops draining.   Follow-up as needed or in about 1 year for your next physical.

## 2012-04-17 NOTE — Progress Notes (Signed)
Addended by: Jone Baseman D on: 04/17/2012 05:04 PM   Modules accepted: Orders

## 2012-04-17 NOTE — Progress Notes (Signed)
  Subjective:    Patient ID: [name], female    DOB: [birth year], 21 y.o.   MRN: [id number]  HPI    Review of Systems     Objective:   Physical Exam  Procedure note: Nexplanon insertion Patient given informed consent, signed copy in the chart.  Appropriate time out taken.  Pregnancy test was negative.  The patient's  left  arm was prepped and draped in the usual sterile fashion. The ruler used to measure and mark the insertion area 8 cm from medial epicondyle of the elbow. Local anaesthesia obtained using 1.5 cc of 1% lidocaine with epinephrine. Nexplanon was inserted per manufacturer's directions. Less than 3 cc blood loss. The insertion site covered with gauze and pressure bandage to minimize bruising. There were no complications and the patient tolerated the procedure well.  Device information was given in handout form. Patient is informed the removal date will be in three years.    Assessment & Plan:

## 2012-05-02 IMAGING — US US OB TRANSVAGINAL
1 series · 13 of 28 positions shown · non-contrast
Comparison: none

[Series 1: us ob transvaginal · 46 acquisitions, 13 frames shown]
[im 2/46]
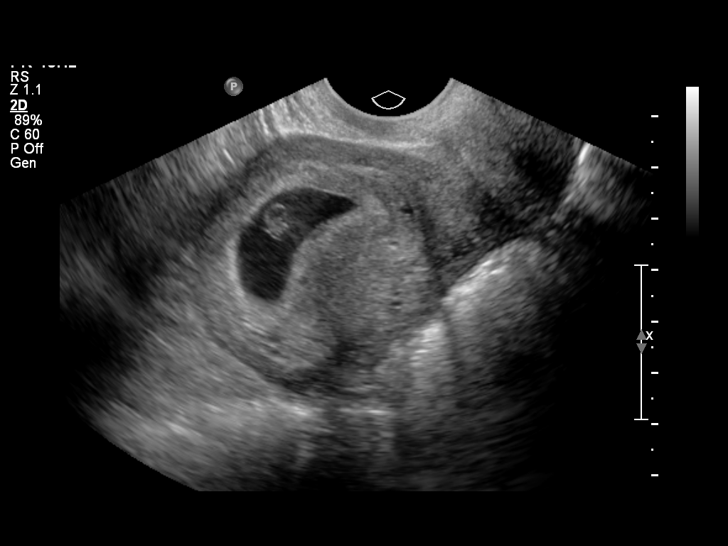
[im 6/46]
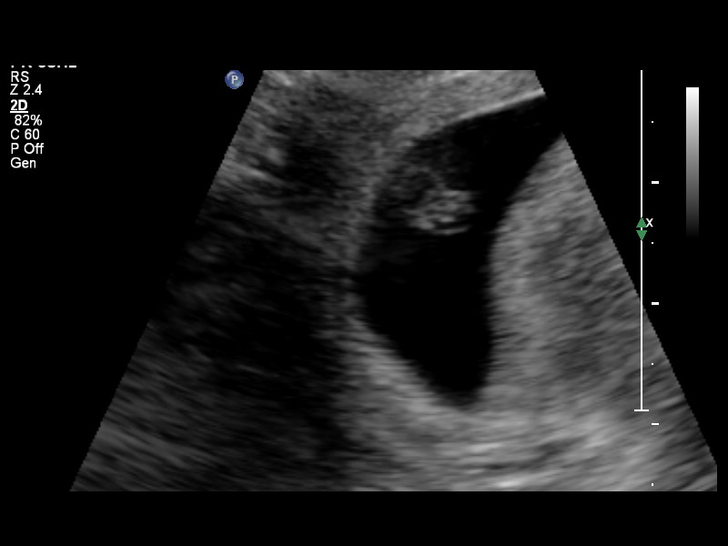
[im 9/46]
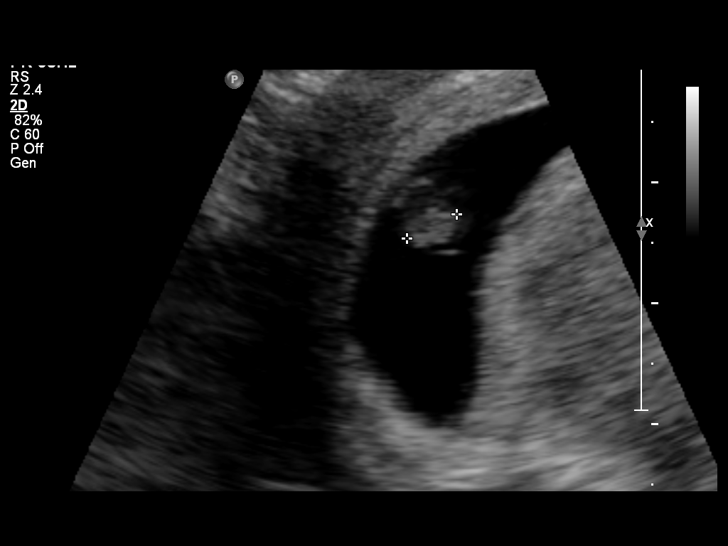
[im 12/46]
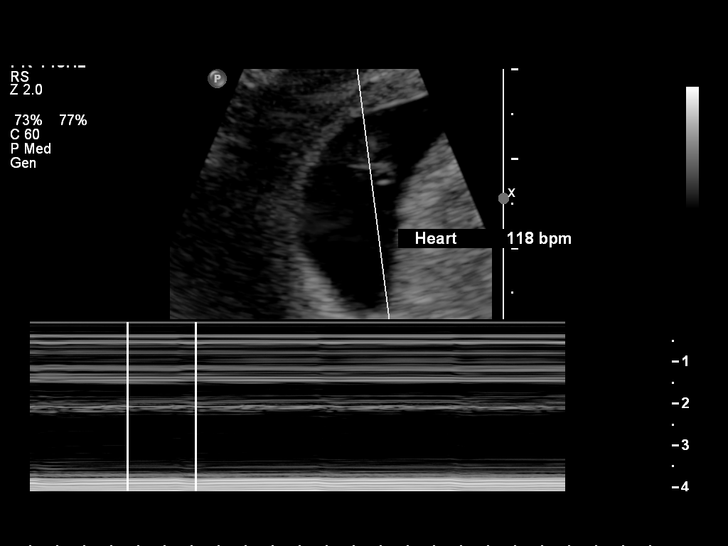
[im 16/46]
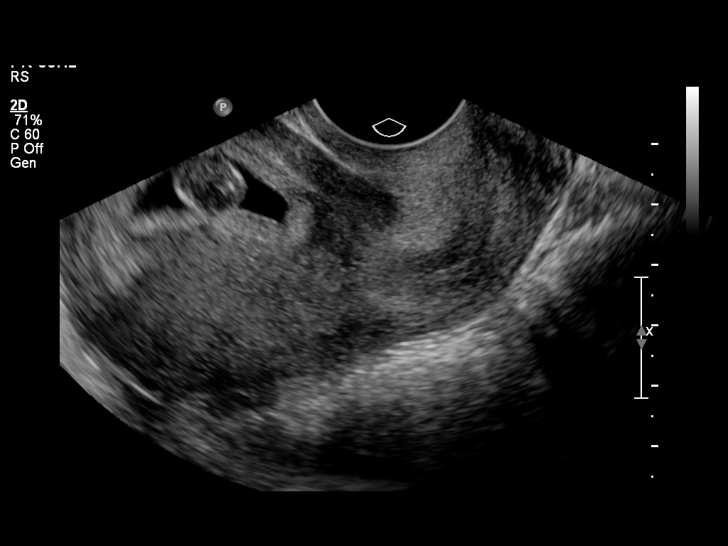
[im 19/46]
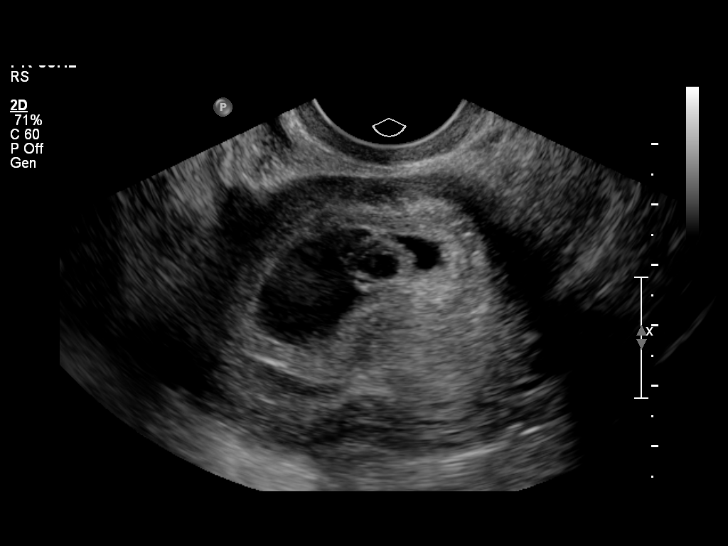
[im 24/46]
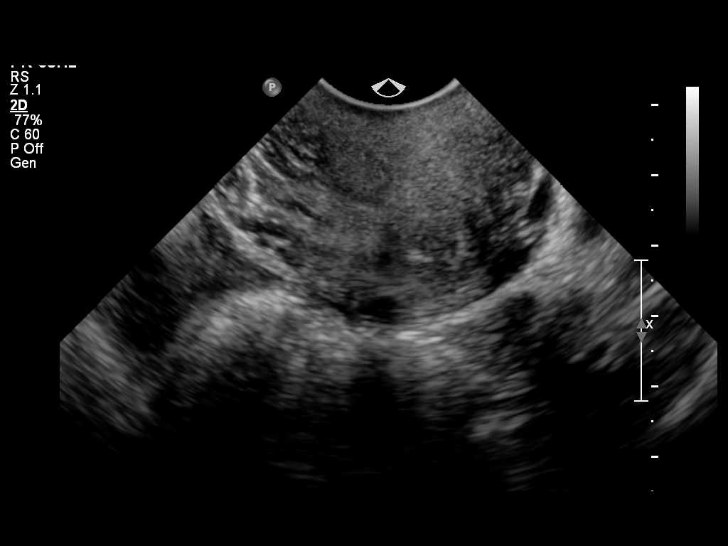
[im 27/46]
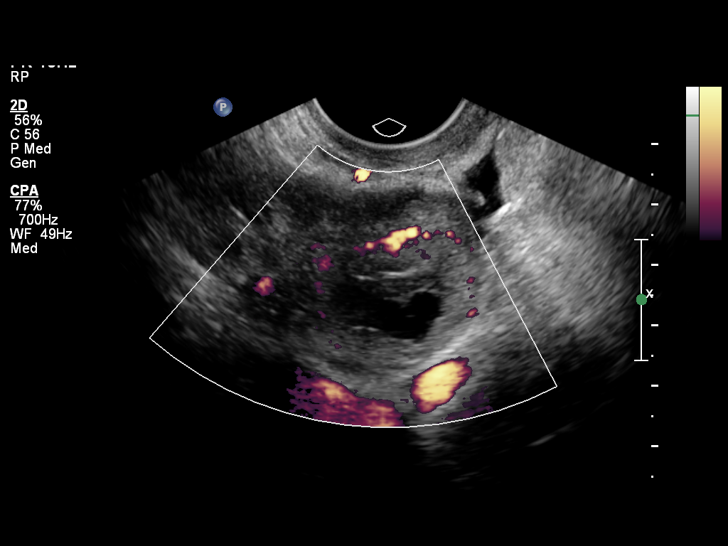
[im 31/46]
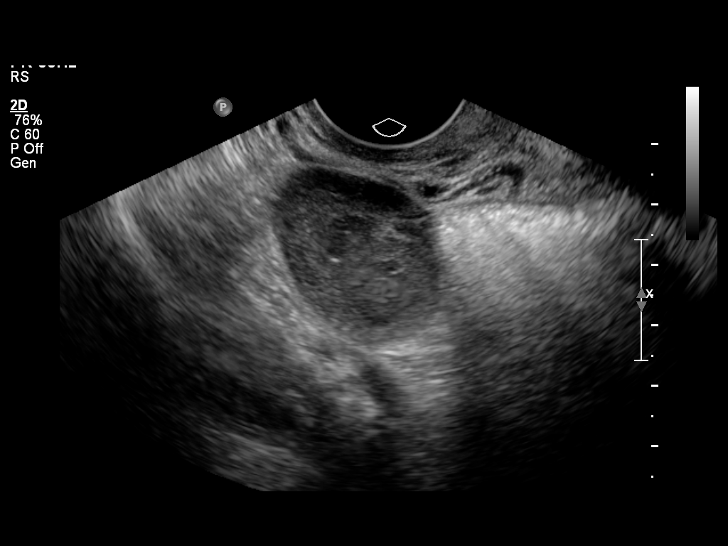
[im 34/46]
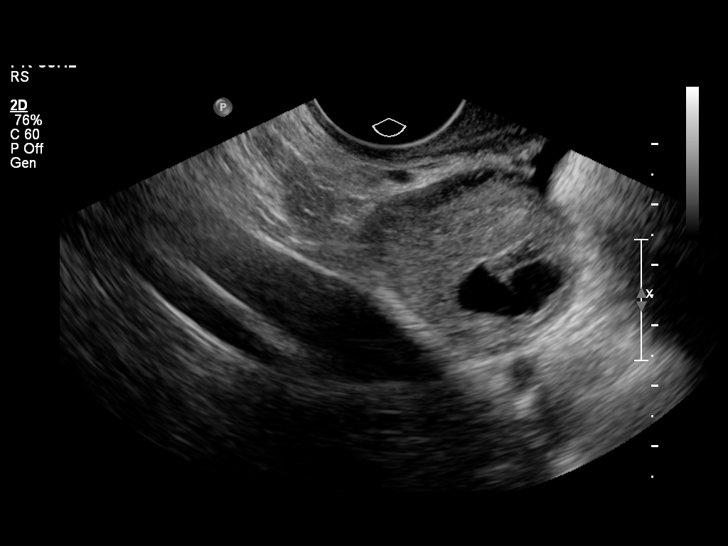
[im 37/46]
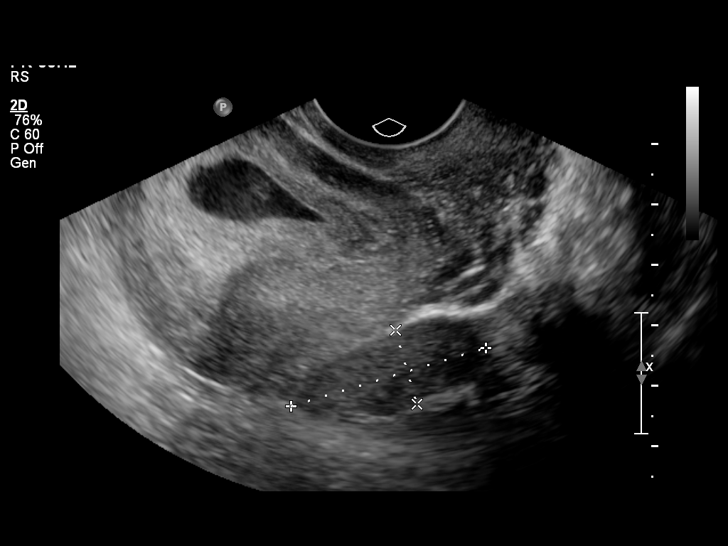
[im 41/46]
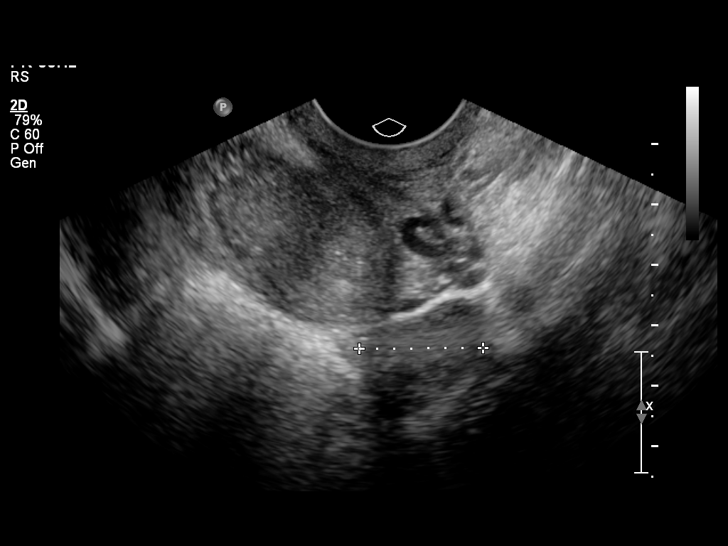
[im 44/46]
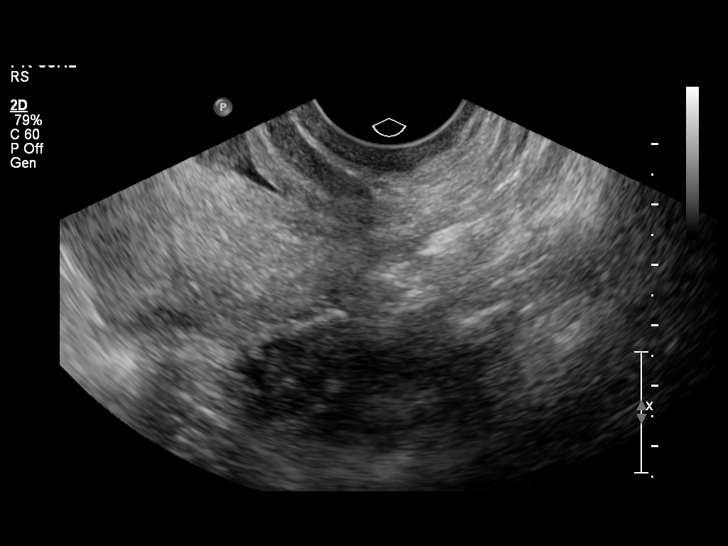

[13 of 28 positions shown; findings below may reference images not displayed]

OBSTETRICS REPORT
                      (Signed Final 06/29/2011 [DATE])

 Order#:         [id number]
Procedures

 US OB TRANSVAGINAL                                    76817.0
Indications

 Chorionic bump
 Assess viability
 Cholecystectomy
Fetal Evaluation

 Preg. Location:    Intrauterine
 Gest. Sac:         Intrauterine
 Yolk Sac:          Visualized
 Fetal Pole:        Visualized
 Fetal Heart Rate:  118                          bpm
 Cardiac Activity:  Observed
Biometry

 CRL:        5  mm     G. Age:  6w 1d                  EDD:    02/21/12
Gestational Age

 LMP:           6w 2d         Date:  05/16/11                 EDD:   02/20/12
 Best:          6w 2d      Det. By:  LMP  (05/16/11)          EDD:   02/20/12
Cervix Uterus Adnexa

 Cervix:       Closed.
 Uterus:       Normal shape and size.
 Cul De Sac:   Trace amount of free fluid seen.

 Left Ovary:    Within normal limits measuring 3.4 x 1.3 x 2.1 cm.
 Right Ovary:   Within normal limits measuring 2.5 x 4.0 x 2.9 cm.
                Small corpus luteum noted.
 Adnexa:     No abnormality visualized.
 Comment:    Avascular heterogeneous mass extending into
             gestational sac from chorionic reaction.
Impression

 There is a single living intrauterine pregancy demonstrating
 an EGA by CRL of 6w 1d . This correlates well with expected
 EGA by of 6w 2d  .

 More defined heterogeneous avascular mass extending into
 the gestational sac from the chorion measuring 1.1 x 1.0 x
 1.3 cm and separate from the embryo. Appearance suggests
 internal clot and raises question of a preplacental thrombus.
 Slight thickening of the chorionic plate below this suggests
 this will be preplacental in location. This can be reassessed
 at anatomy evaluation.

## 2012-06-10 ENCOUNTER — Ambulatory Visit (INDEPENDENT_AMBULATORY_CARE_PROVIDER_SITE_OTHER): Payer: Self-pay | Admitting: Family Medicine

## 2012-06-10 ENCOUNTER — Encounter: Payer: Self-pay | Admitting: Family Medicine

## 2012-06-10 VITALS — BP 148/81 | HR 92 | Temp 101.7°F | Ht 64.0 in | Wt 214.7 lb

## 2012-06-10 DIAGNOSIS — J029 Acute pharyngitis, unspecified: Secondary | ICD-10-CM

## 2012-06-10 LAB — POCT RAPID STREP A (OFFICE): Rapid Strep A Screen: POSITIVE — AB

## 2012-06-10 MED ORDER — PENICILLIN G BENZATHINE 1200000 UNIT/2ML IM SUSP
1.2000 10*6.[IU] | Freq: Once | INTRAMUSCULAR | Status: AC
  Administered 2012-06-10: 1.2 10*6.[IU] via INTRAMUSCULAR

## 2012-06-10 NOTE — Patient Instructions (Addendum)
You have bacteria strep infection in throat. Make sure to avoid coughing, sneezing. You may formula feed for next 2 days, until fever is gone. If you don't get better, or have worsening of symptoms return to care.  Make an appointment for blood pressure check when feeling better.  Strep Throat Strep throat is an infection of the throat caused by a bacteria named Streptococcus pyogenes. Your caregiver may call the infection streptococcal "tonsillitis" or "pharyngitis" depending on whether there are signs of inflammation in the tonsils or back of the throat. Strep throat is most common in children from 60 to 46 years old during the cold months of the year, but it can occur in people of any age during any season. This infection is spread from person to person (contagious) through coughing, sneezing, or other close contact. SYMPTOMS   Fever or chills.   Painful, swollen, red tonsils or throat.   Pain or difficulty when swallowing.   White or yellow spots on the tonsils or throat.   Swollen, tender lymph nodes or "glands" of the neck or under the jaw.   Red rash all over the body (rare).  DIAGNOSIS  Many different infections can cause the same symptoms. A test must be done to confirm the diagnosis so the right treatment can be given. A "rapid strep test" can help your caregiver make the diagnosis in a few minutes. If this test is not available, a light swab of the infected area can be used for a throat culture test. If a throat culture test is done, results are usually available in a day or two. TREATMENT  Strep throat is treated with antibiotic medicine. HOME CARE INSTRUCTIONS   Gargle with 1 tsp of salt in 1 cup of warm water, 3 to 4 times per day or as needed for comfort.   Family members who also have a sore throat or fever should be tested for strep throat and treated with antibiotics if they have the strep infection.   Make sure everyone in your household washes their hands well.    Do not share food, drinking cups, or personal items that could cause the infection to spread to others.   You may need to eat a soft food diet until your sore throat gets better.   Drink enough water and fluids to keep your urine clear or pale yellow. This will help prevent dehydration.   Get plenty of rest.   Stay home from school, daycare, or work until you have been on antibiotics for 24 hours.   Only take over-the-counter or prescription medicines for pain, discomfort, or fever as directed by your caregiver.   If antibiotics are prescribed, take them as directed. Finish them even if you start to feel better.  SEEK MEDICAL CARE IF:   The glands in your neck continue to enlarge.   You develop a rash, cough, or earache.   You cough up green, yellow-brown, or bloody sputum.   You have pain or discomfort not controlled by medicines.   Your problems seem to be getting worse rather than better.  SEEK IMMEDIATE MEDICAL CARE IF:   You develop any new symptoms such as vomiting, severe headache, stiff or painful neck, chest pain, shortness of breath, or trouble swallowing.   You develop severe throat pain, drooling, or changes in your voice.   You develop swelling of the neck, or the skin on the neck becomes red and tender.   You have a fever.   You develop  signs of dehydration, such as fatigue, dry mouth, and decreased urination.   You become increasingly sleepy, or you cannot wake up completely.  Document Released: 12/14/2000 Document Revised: 12/06/2011 Document Reviewed: 02/15/2011 [employer] Patient Information 2012 [place], [place].

## 2012-06-10 NOTE — Progress Notes (Signed)
  Subjective:     [name] is a 21 y.o. female who presents for evaluation of sore throat. Associated symptoms include fevers up to 101 degrees, post nasal drip, sinus and nasal congestion and sore throat. Onset of symptoms was 1 day ago, and have been gradually worsening since that time. She is drinking plenty of fluids. She has not had a recent close exposure to someone with proven streptococcal pharyngitis. She works at [employer] and left work today due to fever. She is breast and bottle feeding her baby who is 63 months old. Denies any rash, headaches, chest pain, dyspnea, emesis, dysuria, decreased urination.   The following portions of the patient's history were reviewed and updated as appropriate: allergies, current medications, past medical history, past social history, past surgical history and problem list.  Review of Systems Pertinent items are noted in HPI.    Objective:    BP 148/81  Pulse 92  Temp(Src) 101.7 F (38.7 C) (Oral)  Ht 5\' 4"  (1.626 m)  Wt 214 lb 11.2 oz (97.387 kg)  BMI 36.85 kg/m2 General appearance: alert, cooperative and fatigued Head: Normocephalic, without obvious abnormality, atraumatic Eyes: conjunctivae/corneas clear. PERRL, EOM's intact.  Ears: normal TM's and external ear canals both ears Nose: Nares normal. Septum midline. Mucosa normal. No drainage or sinus tenderness. Throat: abnormal findings: exudates present and marked oropharyngeal erythema Neck: supple, symmetrical, trachea midline, thyroid not enlarged, symmetric, no tenderness/mass/nodules and bilatearl posterior LAD. Lungs: clear to auscultation bilaterally Heart: regular rate and rhythm, S1, S2 normal, no murmur, click, rub or gallop Abdomen: soft, non-tender; bowel sounds normal; no masses,  no organomegaly Extremities: extremities normal, atraumatic, no cyanosis or edema Skin: Skin color, texture, turgor normal. No rashes or lesions Neurologic: Grossly normal  Laboratory Strep  test done. Results:positive.    Assessment:    Acute pharyngitis, likely  Strep throat.    Plan:   PCN IM 1.2 mill x 1 today.   Use of OTC analgesics recommended as well as salt water gargles. Patient advised that he will be infectious for 24 hours after starting antibiotics. Follow up in a few weeks for BP check-mildly elevated today.  discussed avoidance of breastfeeding for next 48 hours, provided droplet mask to avoid transmission while febrile.

## 2012-08-18 ENCOUNTER — Encounter (HOSPITAL_COMMUNITY): Payer: Self-pay | Admitting: *Deleted

## 2012-08-18 ENCOUNTER — Emergency Department (HOSPITAL_COMMUNITY)
Admission: EM | Admit: 2012-08-18 | Discharge: 2012-08-18 | Disposition: A | Discharge status: Home / Self Care | Payer: No Typology Code available for payment source | Attending: Emergency Medicine | Admitting: Emergency Medicine

## 2012-08-18 DIAGNOSIS — J45909 Unspecified asthma, uncomplicated: Secondary | ICD-10-CM | POA: Insufficient documentation

## 2012-08-18 DIAGNOSIS — Y92410 Unspecified street and highway as the place of occurrence of the external cause: Secondary | ICD-10-CM | POA: Insufficient documentation

## 2012-08-18 DIAGNOSIS — Z8489 Family history of other specified conditions: Secondary | ICD-10-CM | POA: Insufficient documentation

## 2012-08-18 DIAGNOSIS — Z8249 Family history of ischemic heart disease and other diseases of the circulatory system: Secondary | ICD-10-CM | POA: Insufficient documentation

## 2012-08-18 DIAGNOSIS — S0990XA Unspecified injury of head, initial encounter: Secondary | ICD-10-CM | POA: Insufficient documentation

## 2012-08-18 MED ORDER — CYCLOBENZAPRINE HCL 10 MG PO TABS: 10.0000 mg | ORAL_TABLET | Freq: Three times a day (TID) | ORAL | Status: AC | PRN

## 2012-08-18 MED ORDER — ACETAMINOPHEN 325 MG PO TABS
650.0000 mg | ORAL_TABLET | Freq: Once | ORAL | Status: AC
  Administered 2012-08-18: 650 mg via ORAL

## 2012-08-18 MED ORDER — ACETAMINOPHEN 325 MG PO TABS
ORAL_TABLET | ORAL | Status: AC
  Administered 2012-08-18: 650 mg via ORAL
  Filled 2012-08-18: qty 2
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
## 2013-01-29 ENCOUNTER — Other Ambulatory Visit: Payer: Self-pay | Admitting: Family Medicine

## 2013-12-25 ENCOUNTER — Emergency Department (HOSPITAL_COMMUNITY)
Admission: EM | Admit: 2013-12-25 | Discharge: 2013-12-25 | Disposition: A | Discharge status: Home / Self Care | Payer: BC Managed Care – PPO | Attending: Emergency Medicine | Admitting: Emergency Medicine

## 2013-12-25 ENCOUNTER — Encounter (HOSPITAL_COMMUNITY): Payer: Self-pay | Admitting: Emergency Medicine

## 2013-12-25 DIAGNOSIS — Z79899 Other long term (current) drug therapy: Secondary | ICD-10-CM | POA: Insufficient documentation

## 2013-12-25 DIAGNOSIS — J019 Acute sinusitis, unspecified: Secondary | ICD-10-CM | POA: Insufficient documentation

## 2013-12-25 DIAGNOSIS — J45901 Unspecified asthma with (acute) exacerbation: Secondary | ICD-10-CM | POA: Insufficient documentation

## 2013-12-25 DIAGNOSIS — IMO0001 Reserved for inherently not codable concepts without codable children: Secondary | ICD-10-CM | POA: Insufficient documentation

## 2013-12-25 MED ORDER — ACETAMINOPHEN 325 MG PO TABS
650.0000 mg | ORAL_TABLET | Freq: Once | ORAL | Status: AC
  Administered 2013-12-25: 650 mg via ORAL
  Filled 2013-12-25: qty 2

## 2013-12-25 NOTE — ED Notes (Signed)
Per pt cold symptoms since the 24th, head, nasal congestion, no relief with OTC meds

## 2013-12-25 NOTE — ED Provider Notes (Signed)
CSN: [id number]     Arrival date & time 12/25/13  1015 History   First MD Initiated Contact with Patient 12/25/13 1113     Chief Complaint  Patient presents with  . URI   (Consider location/radiation/quality/duration/timing/severity/associated sxs/prior Treatment) Patient is a 22 y.o. female presenting with URI.  URI Presenting symptoms: congestion, cough, fever and sore throat   Congestion:    Location:  Nasal Cough:    Cough characteristics:  Productive   Severity:  Mild Fever:    Fever duration: today. Severity:  Moderate Onset quality:  Gradual Duration:  3 days Timing:  Constant Progression:  Worsening Chronicity:  New Relieved by:  Nothing Ineffective treatments: dayquil. Associated symptoms: headaches and sinus pain   Risk factors: sick contacts (daughter)     Past Medical History  Diagnosis Date  . Asthma    Past Surgical History  Procedure Laterality Date  . Cholecystectomy    . Tonsillectomy     Family History  Problem Relation Age of Onset  . Anesthesia problems Neg Hx   . Hypotension Neg Hx   . Malignant hyperthermia Neg Hx   . Pseudochol deficiency Neg Hx    History  Substance Use Topics  . Smoking status: Never Smoker   . Smokeless tobacco: Never Used  . Alcohol Use: No   OB History   Grav Para Term Preterm Abortions TAB SAB Ect Mult Living   1 1  1      1      Review of Systems  Constitutional: Positive for fever.  HENT: Positive for congestion and sore throat.   Respiratory: Positive for cough. Negative for shortness of breath.   Cardiovascular: Negative for chest pain.  Gastrointestinal: Negative for nausea, vomiting, abdominal pain and diarrhea.  Neurological: Positive for headaches.  All other systems reviewed and are negative.    Allergies  Review of patient's allergies indicates no known allergies.  Home Medications   Current Outpatient Rx  Name  Route  Sig  Dispense  Refill  . PROAIR HFA 108 (90 BASE) MCG/ACT inhaler       INHALE 2 PUFFS BY MOUTH INTO LUNGS EVERY 6 HOURS FOR WHEEZING   18 each   6   . Pseudoephedrine-APAP-DM (DAYQUIL PO)   Oral   Take 30 mLs by mouth daily.          BP 146/100  Pulse 118  Temp(Src) 103 F (39.4 C) (Oral)  Resp 18  SpO2 96%  LMP 12/25/2013 Physical Exam  Nursing note and vitals reviewed. Constitutional: She is oriented to person, place, and time. She appears well-developed and well-nourished. No distress.  HENT:  Head: Normocephalic and atraumatic.  Nose: Right sinus exhibits maxillary sinus tenderness and frontal sinus tenderness. Left sinus exhibits maxillary sinus tenderness and frontal sinus tenderness.  Mouth/Throat: Posterior oropharyngeal erythema (s/p tonsillectomy) present. No oropharyngeal exudate or tonsillar abscesses.  Eyes: Conjunctivae are normal. Pupils are equal, round, and reactive to light. No scleral icterus.  Neck: Neck supple.  Cardiovascular: Normal rate, regular rhythm, normal heart sounds and intact distal pulses.   No murmur heard. Pulmonary/Chest: Effort normal. No stridor. No respiratory distress. She has no decreased breath sounds. She has wheezes (mild). She has no rales.  Abdominal: Soft. Bowel sounds are normal. She exhibits no distension. There is no tenderness.  Musculoskeletal: Normal range of motion.  Neurological: She is alert and oriented to person, place, and time.  Skin: Skin is warm and dry. No rash noted.  Psychiatric: She  has a normal mood and affect. Her behavior is normal.    ED Course  Procedures (including critical care time) Labs Review Labs Reviewed - No data to display Imaging Review No results found.  EKG Interpretation   None       MDM   1. Acute sinusitis    22 yo female with 3 days of chills, sinus pressure, congestion, associated with myalgias, mild cough and mild sore throat.  Symptoms most likely represent acute sinusitis.  Recommended supportive treatment.  Flu considered, but felt less  likely.  I don't feel oseltamivir indicated.  PNA also felt to be unlikely.    Candyce Churn, MD 12/25/13 [phone]

## 2013-12-25 NOTE — ED Notes (Signed)
Pt complains of generalized body aches and sinus pressure.  Pt complains of sore throat.  Reports fever.  Denies cough.

## 2014-02-23 ENCOUNTER — Encounter (HOSPITAL_COMMUNITY): Payer: Self-pay | Admitting: Emergency Medicine

## 2014-02-23 DIAGNOSIS — N12 Tubulo-interstitial nephritis, not specified as acute or chronic: Secondary | ICD-10-CM | POA: Insufficient documentation

## 2014-02-23 DIAGNOSIS — Z3202 Encounter for pregnancy test, result negative: Secondary | ICD-10-CM | POA: Insufficient documentation

## 2014-02-23 DIAGNOSIS — Z9089 Acquired absence of other organs: Secondary | ICD-10-CM | POA: Insufficient documentation

## 2014-02-23 DIAGNOSIS — Z79899 Other long term (current) drug therapy: Secondary | ICD-10-CM | POA: Insufficient documentation

## 2014-02-23 DIAGNOSIS — J45909 Unspecified asthma, uncomplicated: Secondary | ICD-10-CM | POA: Insufficient documentation

## 2014-02-23 MED ORDER — ACETAMINOPHEN 325 MG PO TABS
650.0000 mg | ORAL_TABLET | Freq: Four times a day (QID) | ORAL | Status: DC | PRN
  Administered 2014-02-23: 650 mg via ORAL
  Filled 2014-02-23: qty 2

## 2014-02-23 NOTE — ED Notes (Signed)
Patient presents stating that she has been feeling bad all day.   +nausea, back pain, light headed

## 2014-02-24 ENCOUNTER — Emergency Department (HOSPITAL_COMMUNITY): Payer: BC Managed Care – PPO

## 2014-02-24 ENCOUNTER — Emergency Department (HOSPITAL_COMMUNITY)
Admission: EM | Admit: 2014-02-24 | Discharge: 2014-02-24 | Disposition: A | Discharge status: Home / Self Care | Payer: BC Managed Care – PPO | Attending: Emergency Medicine | Admitting: Emergency Medicine

## 2014-02-24 DIAGNOSIS — N12 Tubulo-interstitial nephritis, not specified as acute or chronic: Secondary | ICD-10-CM

## 2014-02-24 LAB — COMPREHENSIVE METABOLIC PANEL
ALBUMIN: 3.6 g/dL (ref 3.5–5.2)
ALT: 18 U/L (ref 0–35)
AST: 22 U/L (ref 0–37)
Alkaline Phosphatase: 80 U/L (ref 39–117)
BUN: 11 mg/dL (ref 6–23)
CALCIUM: 9 mg/dL (ref 8.4–10.5)
CO2: 22 mEq/L (ref 19–32)
CREATININE: 0.93 mg/dL (ref 0.50–1.10)
Chloride: 101 mEq/L (ref 96–112)
GFR calc Af Amer: >90 mL/min (ref >90)
GFR, EST NON AFRICAN AMERICAN: 86 mL/min — AB (ref >90)
Glucose, Bld: 102 mg/dL — ABNORMAL HIGH (ref 70–99)
Potassium: 3.5 mEq/L — ABNORMAL LOW (ref 3.7–5.3)
SODIUM: 138 meq/L (ref 137–147)
TOTAL PROTEIN: 7.8 g/dL (ref 6.0–8.3)
Total Bilirubin: 0.6 mg/dL (ref 0.3–1.2)

## 2014-02-24 LAB — URINE MICROSCOPIC-ADD ON

## 2014-02-24 LAB — CBC WITH DIFFERENTIAL/PLATELET
BASOS ABS: 0 10*3/uL (ref 0.0–0.1)
BASOS PCT: 0 % (ref 0–1)
EOS ABS: 0 10*3/uL (ref 0.0–0.7)
EOS PCT: 0 % (ref 0–5)
HEMATOCRIT: 38.5 % (ref 36.0–46.0)
Hemoglobin: 13.3 g/dL (ref 12.0–15.0)
Lymphocytes Relative: 13 % (ref 12–46)
Lymphs Abs: 0.9 10*3/uL (ref 0.7–4.0)
MCH: 28.3 pg (ref 26.0–34.0)
MCHC: 34.5 g/dL (ref 30.0–36.0)
MCV: 81.9 fL (ref 78.0–100.0)
MONO ABS: 0.5 10*3/uL (ref 0.1–1.0)
Monocytes Relative: 8 % (ref 3–12)
Neutro Abs: 5.2 10*3/uL (ref 1.7–7.7)
Neutrophils Relative %: 79 % — ABNORMAL HIGH (ref 43–77)
PLATELETS: 367 10*3/uL (ref 150–400)
RBC: 4.7 MIL/uL (ref 3.87–5.11)
RDW: 16 % — AB (ref 11.5–15.5)
WBC: 6.6 10*3/uL (ref 4.0–10.5)

## 2014-02-24 LAB — URINALYSIS, ROUTINE W REFLEX MICROSCOPIC
Bilirubin Urine: NEGATIVE
Glucose, UA: NEGATIVE mg/dL
HGB URINE DIPSTICK: NEGATIVE
Ketones, ur: 15 mg/dL — AB
Nitrite: POSITIVE — AB
PH: 6 (ref 5.0–8.0)
Protein, ur: NEGATIVE mg/dL
SPECIFIC GRAVITY, URINE: 1.032 — AB (ref 1.005–1.030)
Urobilinogen, UA: 0.2 mg/dL (ref 0.0–1.0)

## 2014-02-24 LAB — POC URINE PREG, ED: PREG TEST UR: NEGATIVE

## 2014-02-24 MED ORDER — POTASSIUM CHLORIDE CRYS ER 20 MEQ PO TBCR
40.0000 meq | EXTENDED_RELEASE_TABLET | Freq: Once | ORAL | Status: AC
  Administered 2014-02-24: 40 meq via ORAL
  Filled 2014-02-24: qty 2

## 2014-02-24 MED ORDER — CEPHALEXIN 500 MG PO CAPS: 500.0000 mg | ORAL_CAPSULE | Freq: Four times a day (QID) | ORAL | Status: DC

## 2014-02-24 MED ORDER — IBUPROFEN 800 MG PO TABS: 800.0000 mg | ORAL_TABLET | Freq: Three times a day (TID) | ORAL | Status: DC

## 2014-02-24 MED ORDER — SODIUM CHLORIDE 0.9 % IV BOLUS (SEPSIS)
1000.0000 mL | Freq: Once | INTRAVENOUS | Status: AC
  Administered 2014-02-24: 1000 mL via INTRAVENOUS

## 2014-02-24 MED ORDER — DEXTROSE 5 % IV SOLN
1.0000 g | Freq: Once | INTRAVENOUS | Status: AC
  Administered 2014-02-24: 1 g via INTRAVENOUS
  Filled 2014-02-24: qty 10

## 2014-02-24 MED ORDER — ONDANSETRON HCL 4 MG/2ML IJ SOLN
4.0000 mg | Freq: Once | INTRAMUSCULAR | Status: AC
  Administered 2014-02-24: 4 mg via INTRAVENOUS
  Filled 2014-02-24: qty 2

## 2014-02-24 MED ORDER — PROMETHAZINE HCL 25 MG PO TABS: 25.0000 mg | ORAL_TABLET | Freq: Four times a day (QID) | ORAL | Status: DC | PRN

## 2014-02-24 NOTE — ED Notes (Signed)
Pt states lower back pain to both sides. States it feels like when she had gallstones however she had her gallbladder removed. Nausea still present.

## 2014-02-24 NOTE — Discharge Instructions (Signed)

## 2014-02-24 NOTE — ED Provider Notes (Signed)
CSN: [id number]     Arrival date & time 02/23/14  2325 History   First MD Initiated Contact with Patient 02/24/14 0134     Chief Complaint  Patient presents with  . Fever  . Generalized Body Aches     (Consider location/radiation/quality/duration/timing/severity/associated sxs/prior Treatment) HPI Provided by patient. Has a history of asthma but denies any other medical problems. Workup today with body aches, low back discomfort and tonight developed a fever. Some urinary frequency but no dysuria or urgency. No vaginal discharge. Has nausea but no vomiting. No diarrhea. No known sick contacts. No chest pain. No abdominal pain. No difficulty breathing. No recent travel and no known sick contacts. Symptoms moderate severity. Noted to have a fever of 101.2 in triage Past Medical History  Diagnosis Date  . Asthma    Past Surgical History  Procedure Laterality Date  . Cholecystectomy    . Tonsillectomy     Family History  Problem Relation Age of Onset  . Anesthesia problems Neg Hx   . Hypotension Neg Hx   . Malignant hyperthermia Neg Hx   . Pseudochol deficiency Neg Hx    History  Substance Use Topics  . Smoking status: Never Smoker   . Smokeless tobacco: Never Used  . Alcohol Use: No   OB History   Grav Para Term Preterm Abortions TAB SAB Ect Mult Living   1 1  1      1      Review of Systems  Constitutional: Positive for fever. Negative for fatigue.  HENT: Negative for congestion and sore throat.   Respiratory: Negative for cough, shortness of breath and wheezing.   Cardiovascular: Negative for chest pain.  Gastrointestinal: Negative for abdominal pain.  Genitourinary: Positive for frequency. Negative for dysuria and hematuria.  Musculoskeletal: Positive for back pain. Negative for neck pain.  Skin: Negative for rash.  Neurological: Negative for headaches.  All other systems reviewed and are negative.      Allergies  Review of patient's allergies indicates no  known allergies.  Home Medications   Current Outpatient Rx  Name  Route  Sig  Dispense  Refill  . albuterol (PROVENTIL HFA;VENTOLIN HFA) 108 (90 BASE) MCG/ACT inhaler   Inhalation   Inhale 2 puffs into the lungs every 6 (six) hours as needed for wheezing or shortness of breath.          BP 136/78  Pulse 133  Temp(Src) 98.1 F (36.7 C) (Oral)  Resp 18  Ht 5\' 4"  (1.626 m)  Wt 240 lb 8 oz (109.09 kg)  BMI 41.26 kg/m2  SpO2 94% Physical Exam  Constitutional: She is oriented to person, place, and time. She appears well-developed and well-nourished.  HENT:  Head: Normocephalic and atraumatic.  Eyes: EOM are normal. Pupils are equal, round, and reactive to light.  Neck: Neck supple.  Cardiovascular: Normal rate, regular rhythm and intact distal pulses.   Pulmonary/Chest: Effort normal and breath sounds normal. No respiratory distress.  Abdominal: She exhibits no distension. There is no tenderness. There is no rebound and no guarding.  Mild left CVA tenderness  Musculoskeletal: Normal range of motion. She exhibits no edema and no tenderness.  Neurological: She is alert and oriented to person, place, and time. No cranial nerve deficit.  Skin: Skin is warm and dry.    ED Course  Procedures (including critical care time) Labs Review Labs Reviewed  CBC WITH DIFFERENTIAL - Abnormal; Notable for the following:    RDW 16.0 (*)  Neutrophils Relative % 79 (*)    All other components within normal limits  COMPREHENSIVE METABOLIC PANEL - Abnormal; Notable for the following:    Potassium 3.5 (*)    Glucose, Bld 102 (*)    GFR calc non Af Amer 86 (*)    All other components within normal limits  URINALYSIS, ROUTINE W REFLEX MICROSCOPIC - Abnormal; Notable for the following:    APPearance CLOUDY (*)    Specific Gravity, Urine 1.032 (*)    Ketones, ur 15 (*)    Nitrite POSITIVE (*)    Leukocytes, UA SMALL (*)    All other components within normal limits  URINE MICROSCOPIC-ADD ON -  Abnormal; Notable for the following:    Squamous Epithelial / LPF FEW (*)    Bacteria, UA MANY (*)    All other components within normal limits  POC URINE PREG, ED   Imaging Review Dg Chest 2 View  02/24/2014   CLINICAL DATA:  Fever.  EXAM: CHEST  2 VIEW  COMPARISON:  DG CHEST 2 VIEW dated 10/05/2009  FINDINGS: Cardiomediastinal silhouette is unremarkable. The lungs are clear without pleural effusions or focal consolidations. Mild perihilar peribronchial cuffing. Trachea projects midline and there is no pneumothorax. Soft tissue planes and included osseous structures are non-suspicious. Surgical clips in the abdomen may reflect cholecystectomy.  IMPRESSION: Perihilar peribronchial cuffing could reflect reactive airway disease or possibly bronchitis. No focal consolidations.   Electronically Signed   By: Awilda Metroourtnay  Bloomer   On: 02/24/2014 00:28   IV fluids. IV Rocephin. IV Zofran. Tylenol and PO fluids  2:06 AM recheck temperature 98 degrees. Patient feels comfortable with plan discharge home. Will prescribe Keflex. Urine culture pending. Patient agrees to strict return precautions/ is a reliable historian.   MDM   Diagnosis: Early pyelonephritis  Urinalysis reviewed as above - many bacteria and nitrite positive Initially tachycardic, heart rate improved with IV fluids and fever control Serial abdominal exams without tenderness IV antibiotics provided Vital signs and nursing notes reviewed and considered   Sunnie NielsenBrian Delories Mauri, MD 02/24/14 [phone]

## 2014-03-02 ENCOUNTER — Other Ambulatory Visit: Payer: Self-pay | Admitting: Family Medicine

## 2014-04-17 ENCOUNTER — Encounter (HOSPITAL_COMMUNITY): Payer: Self-pay | Admitting: Emergency Medicine

## 2014-04-17 ENCOUNTER — Emergency Department (HOSPITAL_COMMUNITY): Payer: BC Managed Care – PPO

## 2014-04-17 DIAGNOSIS — J45901 Unspecified asthma with (acute) exacerbation: Secondary | ICD-10-CM | POA: Insufficient documentation

## 2014-04-17 MED ORDER — ALBUTEROL SULFATE (2.5 MG/3ML) 0.083% IN NEBU
5.0000 mg | INHALATION_SOLUTION | Freq: Once | RESPIRATORY_TRACT | Status: AC
  Administered 2014-04-17: 5 mg via RESPIRATORY_TRACT
  Filled 2014-04-17: qty 6

## 2014-04-17 MED ORDER — IPRATROPIUM BROMIDE 0.02 % IN SOLN
0.5000 mg | Freq: Once | RESPIRATORY_TRACT | Status: AC
  Administered 2014-04-17: 0.5 mg via RESPIRATORY_TRACT
  Filled 2014-04-17: qty 2.5

## 2014-04-17 NOTE — ED Notes (Signed)
Patient states that she started with nasal and chest congestion a few hours ago.  Patient also does have a cough, dry nonproductive.  Patient states she has been using her inhaler with no relief.  Patient with wheezing in all fields.

## 2014-04-18 ENCOUNTER — Emergency Department (HOSPITAL_COMMUNITY)
Admission: EM | Admit: 2014-04-18 | Discharge: 2014-04-18 | Discharge status: Against Medical Advice | Payer: BC Managed Care – PPO | Attending: Emergency Medicine | Admitting: Emergency Medicine

## 2014-04-18 NOTE — ED Notes (Signed)
Pts name called for a room no answer 

## 2014-08-23 ENCOUNTER — Other Ambulatory Visit (HOSPITAL_COMMUNITY)
Admission: RE | Admit: 2014-08-23 | Discharge: 2014-08-23 | Disposition: A | Discharge status: Home / Self Care | Payer: BC Managed Care – PPO | Source: Ambulatory Visit | Attending: Family Medicine | Admitting: Family Medicine

## 2014-08-23 ENCOUNTER — Encounter: Payer: Self-pay | Admitting: Family Medicine

## 2014-08-23 ENCOUNTER — Ambulatory Visit (INDEPENDENT_AMBULATORY_CARE_PROVIDER_SITE_OTHER): Payer: BC Managed Care – PPO | Admitting: Family Medicine

## 2014-08-23 VITALS — BP 154/90 | HR 72 | Temp 98.6°F | Wt 237.0 lb

## 2014-08-23 DIAGNOSIS — Z113 Encounter for screening for infections with a predominantly sexual mode of transmission: Secondary | ICD-10-CM | POA: Diagnosis present

## 2014-08-23 DIAGNOSIS — Z01419 Encounter for gynecological examination (general) (routine) without abnormal findings: Secondary | ICD-10-CM | POA: Diagnosis not present

## 2014-08-23 MED ORDER — HYDROCHLOROTHIAZIDE 12.5 MG PO TABS: 12.5000 mg | ORAL_TABLET | Freq: Every day | ORAL | Status: DC

## 2014-08-23 MED ORDER — ALBUTEROL SULFATE HFA 108 (90 BASE) MCG/ACT IN AERS: 2.0000 | INHALATION_SPRAY | Freq: Four times a day (QID) | RESPIRATORY_TRACT | Status: DC | PRN

## 2014-08-23 NOTE — Progress Notes (Signed)
   Subjective:    Patient ID: [name], female    DOB: 1991/10/06, 23 y.o.   MRN: [id number]  HPI  Pap: last 09/2011, normal Vitamins: no supplements Menses: amenorrheic x 2 years with nexplanon, had period x 3 weeks recently but has since resolved.   Asthma: SOB when working out, no night time awakenings  Review of Systems  Constitutional: Negative for chills and diaphoresis.  Respiratory: Negative for cough and shortness of breath.   Cardiovascular: Negative for leg swelling.  Gastrointestinal: Negative for abdominal pain, diarrhea, constipation and anal bleeding.  Endocrine: Negative for cold intolerance and heat intolerance.  Genitourinary: Negative for dysuria, urgency, decreased urine volume and difficulty urinating.  Neurological: Negative for headaches.       Objective:   Physical Exam  Constitutional: She appears well-developed and well-nourished. No distress.  HENT:  Head: Normocephalic and atraumatic.  Eyes: Conjunctivae are normal. Pupils are equal, round, and reactive to light.  Neck: Normal range of motion. Neck supple. No thyromegaly present.  Cardiovascular: Normal rate.   Pulmonary/Chest: Effort normal. No respiratory distress.  Abdominal: Soft. She exhibits no distension. There is no tenderness.  Genitourinary: Vagina normal and uterus normal.  Cervix visualized with speculum, no discharge  Musculoskeletal: She exhibits no edema.  Skin: Skin is warm and dry. No rash noted. She is not diaphoretic. No erythema.  Psychiatric: She has a normal mood and affect. Her behavior is normal.    BP 154/90  Pulse 72  Temp(Src) 98.6 F (37 C) (Oral)  Wt 237 lb (107.502 kg)  LMP 07/31/2014       Assessment & Plan:  Asthma: RF albuterol  WWE: pap, HIV, syphilis, G/C  Elevated blood pressure: life style modifications, low salt diet, exercise, will also start HCTZ 12.5mg

## 2014-08-23 NOTE — Patient Instructions (Signed)

## 2014-08-24 LAB — HIV ANTIBODY (ROUTINE TESTING W REFLEX): HIV 1&2 Ab, 4th Generation: NONREACTIVE

## 2014-08-24 LAB — RPR

## 2014-08-25 LAB — CYTOLOGY - PAP

## 2014-11-01 ENCOUNTER — Encounter: Payer: Self-pay | Admitting: Family Medicine

## 2015-05-07 ENCOUNTER — Encounter (HOSPITAL_COMMUNITY): Payer: Self-pay | Admitting: Emergency Medicine

## 2015-05-07 ENCOUNTER — Emergency Department (HOSPITAL_COMMUNITY)
Admission: EM | Admit: 2015-05-07 | Discharge: 2015-05-07 | Disposition: A | Discharge status: Home / Self Care | Payer: Medicaid Other | Attending: Emergency Medicine | Admitting: Emergency Medicine

## 2015-05-07 ENCOUNTER — Emergency Department (HOSPITAL_COMMUNITY): Payer: Medicaid Other

## 2015-05-07 DIAGNOSIS — Z791 Long term (current) use of non-steroidal anti-inflammatories (NSAID): Secondary | ICD-10-CM | POA: Insufficient documentation

## 2015-05-07 DIAGNOSIS — J45901 Unspecified asthma with (acute) exacerbation: Secondary | ICD-10-CM

## 2015-05-07 DIAGNOSIS — R000 Tachycardia, unspecified: Secondary | ICD-10-CM | POA: Insufficient documentation

## 2015-05-07 DIAGNOSIS — Z792 Long term (current) use of antibiotics: Secondary | ICD-10-CM | POA: Insufficient documentation

## 2015-05-07 DIAGNOSIS — Z79899 Other long term (current) drug therapy: Secondary | ICD-10-CM | POA: Insufficient documentation

## 2015-05-07 MED ORDER — SODIUM CHLORIDE 0.9 % IV BOLUS (SEPSIS)
1000.0000 mL | Freq: Once | INTRAVENOUS | Status: AC
  Administered 2015-05-07: 1000 mL via INTRAVENOUS

## 2015-05-07 MED ORDER — IPRATROPIUM-ALBUTEROL 0.5-2.5 (3) MG/3ML IN SOLN
3.0000 mL | Freq: Once | RESPIRATORY_TRACT | Status: AC
  Administered 2015-05-07: 3 mL via RESPIRATORY_TRACT
  Filled 2015-05-07: qty 3

## 2015-05-07 MED ORDER — PREDNISONE 10 MG PO TABS
40.0000 mg | ORAL_TABLET | Freq: Every day | ORAL | Status: DC
Start: 2015-05-07 — End: 2015-09-16

## 2015-05-07 MED ORDER — ALBUTEROL SULFATE HFA 108 (90 BASE) MCG/ACT IN AERS
2.0000 | INHALATION_SPRAY | Freq: Once | RESPIRATORY_TRACT | Status: AC
  Administered 2015-05-07: 2 via RESPIRATORY_TRACT
  Filled 2015-05-07: qty 6.7

## 2015-05-07 MED ORDER — METHYLPREDNISOLONE SODIUM SUCC 125 MG IJ SOLR
125.0000 mg | Freq: Once | INTRAMUSCULAR | Status: AC
  Administered 2015-05-07: 125 mg via INTRAVENOUS
  Filled 2015-05-07: qty 2

## 2015-05-07 NOTE — ED Provider Notes (Signed)
CSN: [id number]     Arrival date & time 05/07/15  1915 History   First MD Initiated Contact with Patient 05/07/15 1923     Chief Complaint  Patient presents with  . Shortness of Breath  . Tachycardia    Patient is a 24 y.o. female presenting with shortness of breath. The history is provided by the patient. No language interpreter was used.  Shortness of Breath  [name] presents for evaluation of SOB.  She has a hx/o asthma and went to visit a friend who happens to have a litter of kittens.  Shortly after she was at the house she developed SOB, cough, wheezing.  She temporarily had some chest tightness, which is now better.  She used her albuterol MDI about 12 times with partial improvement in her sxs.  She does not have a spacer.  She denies fevers, abdominal pain, leg swelling or pain.  Sxs are moderate, constant, improving.    Past Medical History  Diagnosis Date  . Asthma    Past Surgical History  Procedure Laterality Date  . Cholecystectomy    . Tonsillectomy     Family History  Problem Relation Age of Onset  . Anesthesia problems Neg Hx   . Hypotension Neg Hx   . Malignant hyperthermia Neg Hx   . Pseudochol deficiency Neg Hx    History  Substance Use Topics  . Smoking status: Never Smoker   . Smokeless tobacco: Never Used  . Alcohol Use: No   OB History    Gravida Para Term Preterm AB TAB SAB Ectopic Multiple Living   1 1  1      1      Review of Systems  Respiratory: Positive for shortness of breath.   All other systems reviewed and are negative.     Allergies  Review of patient's allergies indicates no known allergies.  Home Medications   Prior to Admission medications   Medication Sig Start Date End Date Taking? Authorizing Provider  albuterol (PROVENTIL HFA;VENTOLIN HFA) 108 (90 BASE) MCG/ACT inhaler Inhale 2 puffs into the lungs every 6 (six) hours as needed for wheezing or shortness of breath. 08/23/14   Fredirick LatheKristy Acosta, MD  cephALEXin (KEFLEX) 500 MG  capsule Take 1 capsule (500 mg total) by mouth 4 (four) times daily. 02/24/14   Sunnie NielsenBrian Opitz, MD  hydrochlorothiazide (HYDRODIURIL) 12.5 MG tablet Take 1 tablet (12.5 mg total) by mouth daily. 08/23/14   Fredirick LatheKristy Acosta, MD  ibuprofen (ADVIL,MOTRIN) 800 MG tablet Take 1 tablet (800 mg total) by mouth 3 (three) times daily. 02/24/14   Sunnie NielsenBrian Opitz, MD  PROAIR HFA 108 [phone](90 BASE) MCG/ACT inhaler INHALE 2 PUFFS BY MOUTH INTO LUNGS EVERY 6 HOURS FOR WHEEZING 03/02/14   Nani RavensAndrew M Wight, MD  promethazine (PHENERGAN) 25 MG tablet Take 1 tablet (25 mg total) by mouth every 6 (six) hours as needed for nausea or vomiting. 02/24/14   Sunnie NielsenBrian Opitz, MD   BP 136/71 mmHg  Pulse 147  Temp(Src) 98.4 F (36.9 C) (Oral)  Resp 24  SpO2 98%  LMP 04/24/2015 Physical Exam  Constitutional: She is oriented to person, place, and time. She appears well-developed and well-nourished.  HENT:  Head: Normocephalic and atraumatic.  Cardiovascular: Regular rhythm.   No murmur heard. tachycardic  Pulmonary/Chest:  Tachypnea, decreased breath sounds bilaterally with occasional end expiratory wheeze  Abdominal: Soft. There is no tenderness. There is no rebound and no guarding.  Musculoskeletal: She exhibits no edema or tenderness.  Neurological: She is  alert and oriented to person, place, and time.  Skin: Skin is warm and dry.  Psychiatric: She has a normal mood and affect. Her behavior is normal.  Nursing note and vitals reviewed.   ED Course  Procedures (including critical care time) Labs Review Labs Reviewed - No data to display  Imaging Review Dg Chest 2 View (if Patient Has Fever And/or Copd)  05/07/2015   CLINICAL DATA:  Shortness of Breath  EXAM: CHEST  2 VIEW  COMPARISON:  04/17/2014  FINDINGS: The heart size and mediastinal contours are within normal limits. Both lungs are clear. The visualized skeletal structures are unremarkable.  IMPRESSION: No active cardiopulmonary disease.   Electronically Signed   By: Alcide CleverMark  Lukens  M.D.   On: 05/07/2015 20:02     EKG Interpretation   Date/Time:  Saturday May 07 2015 19:21:17 EDT Ventricular Rate:  144 PR Interval:  114 QRS Duration: 79 QT Interval:  321 QTC Calculation: 497 R Axis:   79 Text Interpretation:  Sinus tachycardia Consider right atrial enlargement  Nonspecific ST abnormality Prolonged QT interval Confirmed by Lincoln Brighamees, Liz  [phone]) on 05/07/2015 7:32:49 PM      MDM   Final diagnoses:  Acute asthma exacerbation, unspecified asthma severity    Patient here for evaluation of shortness of breath following mannerto cats. She has a history of asthma. Symptoms are improving on evaluation in the emergency department. On repeat evaluation following nebulizer patient has improved air movement bilaterally, occasional end expiratory wheezes. Patient's heart rate does improve after IV fluid administration and rest. Discussed with patient acute asthma exacerbation with home care with albuterol MDI with spacer as well as prednisone therapy and return precautions. Presentation not consistent with acute PE, ACS, pneumonia, CHF.    Stephanie FossaElizabeth Sabas Frett, MD 05/07/15 2259

## 2015-05-07 NOTE — Discharge Instructions (Signed)
Asthma Asthma is a recurring condition in which the airways tighten and narrow. Asthma can make it difficult to breathe. It can cause coughing, wheezing, and shortness of breath. Asthma episodes, also called asthma attacks, range from minor to life-threatening. Asthma cannot be cured, but medicines and lifestyle changes can help control it. CAUSES Asthma is believed to be caused by inherited (genetic) and environmental factors, but its exact cause is unknown. Asthma may be triggered by allergens, lung infections, or irritants in the air. Asthma triggers are different for each person. Common triggers include:   Animal dander.  Dust mites.  Cockroaches.  Pollen from trees or grass.  Mold.  Smoke.  Air pollutants such as dust, household cleaners, hair sprays, aerosol sprays, paint fumes, strong chemicals, or strong odors.  Cold air, weather changes, and winds (which increase molds and pollens in the air).  Strong emotional expressions such as crying or laughing hard.  Stress.  Certain medicines (such as aspirin) or types of drugs (such as beta-blockers).  Sulfites in foods and drinks. Foods and drinks that may contain sulfites include dried fruit, potato chips, and sparkling grape juice.  Infections or inflammatory conditions such as the flu, a cold, or an inflammation of the nasal membranes (rhinitis).  Gastroesophageal reflux disease (GERD).  Exercise or strenuous activity. SYMPTOMS Symptoms may occur immediately after asthma is triggered or many hours later. Symptoms include:  Wheezing.  Excessive nighttime or early morning coughing.  Frequent or severe coughing with a common cold.  Chest tightness.  Shortness of breath. DIAGNOSIS  The diagnosis of asthma is made by a review of your medical history and a physical exam. Tests may also be performed. These may include:  Lung function studies. These tests show how much air you breathe in and out.  Allergy  tests.  Imaging tests such as X-rays. TREATMENT  Asthma cannot be cured, but it can usually be controlled. Treatment involves identifying and avoiding your asthma triggers. It also involves medicines. There are 2 classes of medicine used for asthma treatment:   Controller medicines. These prevent asthma symptoms from occurring. They are usually taken every day.  Reliever or rescue medicines. These quickly relieve asthma symptoms. They are used as needed and provide short-term relief. Your health care provider will help you create an asthma action plan. An asthma action plan is a written plan for managing and treating your asthma attacks. It includes a list of your asthma triggers and how they may be avoided. It also includes information on when medicines should be taken and when their dosage should be changed. An action plan may also involve the use of a device called a peak flow meter. A peak flow meter measures how well the lungs are working. It helps you monitor your condition. HOME CARE INSTRUCTIONS   Take medicines only as directed by your health care provider. Speak with your health care provider if you have questions about how or when to take the medicines.  Use a peak flow meter as directed by your health care provider. Record and keep track of readings.  Understand and use the action plan to help minimize or stop an asthma attack without needing to seek medical care.  Control your home environment in the following ways to help prevent asthma attacks:  Do not smoke. Avoid being exposed to secondhand smoke.  Change your heating and air conditioning filter regularly.  Limit your use of fireplaces and wood stoves.  Get rid of pests (such as roaches and   mice) and their droppings.  Throw away plants if you see mold on them.  Clean your floors and dust regularly. Use unscented cleaning products.  Try to have someone else vacuum for you regularly. Stay out of rooms while they are  being vacuumed and for a short while afterward. If you vacuum, use a dust mask from a hardware store, a double-layered or microfilter vacuum cleaner bag, or a vacuum cleaner with a HEPA filter.  Replace carpet with wood, tile, or vinyl flooring. Carpet can trap dander and dust.  Use allergy-proof pillows, mattress covers, and box spring covers.  Wash bed sheets and blankets every week in hot water and dry them in a dryer.  Use blankets that are made of polyester or cotton.  Clean bathrooms and kitchens with bleach. If possible, have someone repaint the walls in these rooms with mold-resistant paint. Keep out of the rooms that are being cleaned and painted.  Wash hands frequently. SEEK MEDICAL CARE IF:   You have wheezing, shortness of breath, or a cough even if taking medicine to prevent attacks.  The colored mucus you cough up (sputum) is thicker than usual.  Your sputum changes from clear or white to yellow, green, gray, or bloody.  You have any problems that may be related to the medicines you are taking (such as a rash, itching, swelling, or trouble breathing).  You are using a reliever medicine more than 2-3 times per week.  Your peak flow is still at 50-79% of your personal best after following your action plan for 1 hour.  You have a fever. SEEK IMMEDIATE MEDICAL CARE IF:   You seem to be getting worse and are unresponsive to treatment during an asthma attack.  You are short of breath even at rest.  You get short of breath when doing very little physical activity.  You have difficulty eating, drinking, or talking due to asthma symptoms.  You develop chest pain.  You develop a fast heartbeat.  You have a bluish color to your lips or fingernails.  You are light-headed, dizzy, or faint.  Your peak flow is less than 50% of your personal best. MAKE SURE YOU:   Understand these instructions.  Will watch your condition.  Will get help right away if you are not  doing well or get worse. Document Released: 12/17/2005 Document Revised: 05/03/2014 Document Reviewed: 07/16/2013 Exit[employer] Patient Information 2015 [employer]. This information is not intended to replace advice given to you by your health care provider. Make sure you discuss any questions you have with your health care provider.   Asthma Attack Prevention Although there is no way to prevent asthma from starting, you can take steps to control the disease and reduce its symptoms. Learn about your asthma and how to control it. Take an active role to control your asthma by working with your health care provider to create and follow an asthma action plan. An asthma action plan guides you in:  Taking your medicines properly.  Avoiding things that set off your asthma or make your asthma worse (asthma triggers).  Tracking your level of asthma control.  Responding to worsening asthma.  Seeking emergency care when needed. To track your asthma, keep records of your symptoms, check your peak flow number using a handheld device that shows how well air moves out of your lungs (peak flow meter), and get regular asthma checkups.  WHAT ARE SOME WAYS TO PREVENT AN ASTHMA ATTACK?  Take medicines as directed by your   health care provider.  Keep track of your asthma symptoms and level of control.  With your health care provider, write a detailed plan for taking medicines and managing an asthma attack. Then be sure to follow your action plan. Asthma is an ongoing condition that needs regular monitoring and treatment.  Identify and avoid asthma triggers. Many outdoor allergens and irritants (such as pollen, mold, cold air, and air pollution) can trigger asthma attacks. Find out what your asthma triggers are and take steps to avoid them.  Monitor your breathing. Learn to recognize warning signs of an attack, such as coughing, wheezing, or shortness of breath. Your lung function may decrease before you notice  any signs or symptoms, so regularly measure and record your peak airflow with a home peak flow meter.  Identify and treat attacks early. If you act quickly, you are less likely to have a severe attack. You will also need less medicine to control your symptoms. When your peak flow measurements decrease and alert you to an upcoming attack, take your medicine as instructed and immediately stop any activity that may have triggered the attack. If your symptoms do not improve, get medical help.  Pay attention to increasing quick-relief inhaler use. If you find yourself relying on your quick-relief inhaler, your asthma is not under control. See your health care provider about adjusting your treatment. WHAT CAN MAKE MY SYMPTOMS WORSE? A number of common things can set off or make your asthma symptoms worse and cause temporary increased inflammation of your airways. Keep track of your asthma symptoms for several weeks, detailing all the environmental and emotional factors that are linked with your asthma. When you have an asthma attack, go back to your asthma diary to see which factor, or combination of factors, might have contributed to it. Once you know what these factors are, you can take steps to control many of them. If you have allergies and asthma, it is important to take asthma prevention steps at home. Minimizing contact with the substance to which you are allergic will help prevent an asthma attack. Some triggers and ways to avoid these triggers are: Animal Dander:  Some people are allergic to the flakes of skin or dried saliva from animals with fur or feathers.   There is no such thing as a hypoallergenic dog or cat breed. All dogs or cats can cause allergies, even if they don't shed.  Keep these pets out of your home.  If you are not able to keep a pet outdoors, keep the pet out of your bedroom and other sleeping areas at all times, and keep the door closed.  Remove carpets and furniture covered  with cloth from your home. If that is not possible, keep the pet away from fabric-covered furniture and carpets. Dust Mites: Many people with asthma are allergic to dust mites. Dust mites are tiny bugs that are found in every home in mattresses, pillows, carpets, fabric-covered furniture, bedcovers, clothes, stuffed toys, and other fabric-covered items.   Cover your mattress in a special dust-proof cover.  Cover your pillow in a special dust-proof cover, or wash the pillow each week in hot water. Water must be hotter than 130 F (54.4 C) to kill dust mites. Cold or warm water used with detergent and bleach can also be effective.  Wash the sheets and blankets on your bed each week in hot water.  Try not to sleep or lie on cloth-covered cushions.  Call ahead when traveling and ask for a   smoke-free hotel room. Bring your own bedding and pillows in case the hotel only supplies feather pillows and down comforters, which may contain dust mites and cause asthma symptoms.  Remove carpets from your bedroom and those laid on concrete, if you can.  Keep stuffed toys out of the bed, or wash the toys weekly in hot water or cooler water with detergent and bleach. Cockroaches: Many people with asthma are allergic to the droppings and remains of cockroaches.   Keep food and garbage in closed containers. Never leave food out.  Use poison baits, traps, powders, gels, or paste (for example, boric acid).  If a spray is used to kill cockroaches, stay out of the room until the odor goes away. Indoor Mold:  Fix leaky faucets, pipes, or other sources of water that have mold around them.  Clean floors and moldy surfaces with a fungicide or diluted bleach.  Avoid using humidifiers, vaporizers, or swamp coolers. These can spread molds through the air. Pollen and Outdoor Mold:  When pollen or mold spore counts are high, try to keep your windows closed.  Stay indoors with windows closed from late morning to  afternoon. Pollen and some mold spore counts are highest at that time.  Ask your health care provider whether you need to take anti-inflammatory medicine or increase your dose of the medicine before your allergy season starts. Other Irritants to Avoid:  Tobacco smoke is an irritant. If you smoke, ask your health care provider how you can quit. Ask family members to quit smoking, too. Do not allow smoking in your home or car.  If possible, do not use a wood-burning stove, kerosene heater, or fireplace. Minimize exposure to all sources of smoke, including incense, candles, fires, and fireworks.  Try to stay away from strong odors and sprays, such as perfume, talcum powder, hair spray, and paints.  Decrease humidity in your home and use an indoor air cleaning device. Reduce indoor humidity to below 60%. Dehumidifiers or central air conditioners can do this.  Decrease house dust exposure by changing furnace and air cooler filters frequently.  Try to have someone else vacuum for you once or twice a week. Stay out of rooms while they are being vacuumed and for a short while afterward.  If you vacuum, use a dust mask from a hardware store, a double-layered or microfilter vacuum cleaner bag, or a vacuum cleaner with a HEPA filter.  Sulfites in foods and beverages can be irritants. Do not drink beer or wine or eat dried fruit, processed potatoes, or shrimp if they cause asthma symptoms.  Cold air can trigger an asthma attack. Cover your nose and mouth with a scarf on cold or windy days.  Several health conditions can make asthma more difficult to manage, including a runny nose, sinus infections, reflux disease, psychological stress, and sleep apnea. Work with your health care provider to manage these conditions.  Avoid close contact with people who have a respiratory infection such as a cold or the flu, since your asthma symptoms may get worse if you catch the infection. Wash your hands thoroughly  after touching items that may have been handled by people with a respiratory infection.  Get a flu shot every year to protect against the flu virus, which often makes asthma worse for days or weeks. Also get a pneumonia shot if you have not previously had one. Unlike the flu shot, the pneumonia shot does not need to be given yearly. Medicines:  Talk to your   health care provider about whether it is safe for you to take aspirin or non-steroidal anti-inflammatory medicines (NSAIDs). In a small number of people with asthma, aspirin and NSAIDs can cause asthma attacks. These medicines must be avoided by people who have known aspirin-sensitive asthma. It is important that people with aspirin-sensitive asthma read labels of all over-the-counter medicines used to treat pain, colds, coughs, and fever.  Beta-blockers and ACE inhibitors are other medicines you should discuss with your health care provider. HOW CAN I FIND OUT WHAT I AM ALLERGIC TO? Ask your asthma health care provider about allergy skin testing or blood testing (the RAST test) to identify the allergens to which you are sensitive. If you are found to have allergies, the most important thing to do is to try to avoid exposure to any allergens that you are sensitive to as much as possible. Other treatments for allergies, such as medicines and allergy shots (immunotherapy) are available.  CAN I EXERCISE? Follow your health care provider's advice regarding asthma treatment before exercising. It is important to maintain a regular exercise program, but vigorous exercise or exercise in cold, humid, or dry environments can cause asthma attacks, especially for those people who have exercise-induced asthma. Document Released: 12/05/2009 Document Revised: 12/22/2013 Document Reviewed: 06/24/2013 Exit[employer] Patient Information 2015 [employer]. This information is not intended to replace advice given to you by your health care provider. Make sure you discuss  any questions you have with your health care provider.  

## 2015-05-07 NOTE — ED Notes (Signed)
Patient transported to X-ray 

## 2015-05-07 NOTE — ED Notes (Signed)
Pt from home c/o shortness of breath starting today. HX of Asthmas. Expiratory wheezes present bilaterally through out. She has used albuteral inhaler with minimal relief.

## 2015-05-07 NOTE — ED Notes (Addendum)
EKG given to EDP [place] for review 

## 2015-06-13 ENCOUNTER — Emergency Department (HOSPITAL_COMMUNITY)
Admission: EM | Admit: 2015-06-13 | Discharge: 2015-06-13 | Disposition: A | Discharge status: Home / Self Care | Payer: Medicaid Other | Attending: Emergency Medicine | Admitting: Emergency Medicine

## 2015-06-13 ENCOUNTER — Encounter (HOSPITAL_COMMUNITY): Payer: Self-pay | Admitting: Emergency Medicine

## 2015-06-13 DIAGNOSIS — Z880 Allergy status to penicillin: Secondary | ICD-10-CM | POA: Insufficient documentation

## 2015-06-13 DIAGNOSIS — J45909 Unspecified asthma, uncomplicated: Secondary | ICD-10-CM | POA: Insufficient documentation

## 2015-06-13 DIAGNOSIS — J0100 Acute maxillary sinusitis, unspecified: Secondary | ICD-10-CM | POA: Insufficient documentation

## 2015-06-13 DIAGNOSIS — Z791 Long term (current) use of non-steroidal anti-inflammatories (NSAID): Secondary | ICD-10-CM | POA: Insufficient documentation

## 2015-06-13 DIAGNOSIS — Z79899 Other long term (current) drug therapy: Secondary | ICD-10-CM | POA: Insufficient documentation

## 2015-06-13 DIAGNOSIS — Z7952 Long term (current) use of systemic steroids: Secondary | ICD-10-CM | POA: Insufficient documentation

## 2015-06-13 MED ORDER — AZITHROMYCIN 250 MG PO TABS: 250.0000 mg | ORAL_TABLET | Freq: Every day | ORAL | Status: DC

## 2015-06-13 NOTE — Discharge Instructions (Signed)
Sinusitis °Sinusitis is redness, soreness, and puffiness (inflammation) of the air pockets in the bones of your face (sinuses). The redness, soreness, and puffiness can cause air and mucus to get trapped in your sinuses. This can allow germs to grow and cause an infection.  °HOME CARE  °· Drink enough fluids to keep your pee (urine) clear or pale yellow. °· Use a humidifier in your home. °· Run a hot shower to create steam in the bathroom. Sit in the bathroom with the door closed. Breathe in the steam 3-4 times a day. °· Put a warm, moist washcloth on your face 3-4 times a day, or as told by your doctor. °· Use salt water sprays (saline sprays) to wet the thick fluid in your nose. This can help the sinuses drain. °· Only take medicine as told by your doctor. °GET HELP RIGHT AWAY IF:  °· Your pain gets worse. °· You have very bad headaches. °· You are sick to your stomach (nauseous). °· You throw up (vomit). °· You are very sleepy (drowsy) all the time. °· Your face is puffy (swollen). °· Your vision changes. °· You have a stiff neck. °· You have trouble breathing. °MAKE SURE YOU:  °· Understand these instructions. °· Will watch your condition. °· Will get help right away if you are not doing well or get worse. °Document Released: 06/04/2008 Document Revised: 09/10/2012 Document Reviewed: 07/22/2012 °Exit[employer]® Patient Information ©2015 [employer]. This information is not intended to replace advice given to you by your health care provider. Make sure you discuss any questions you have with your health care provider. ° °

## 2015-06-13 NOTE — ED Notes (Signed)
Patient c/o of facial pain around the nose and sore throat ongoing since yesterday.

## 2015-06-13 NOTE — ED Provider Notes (Signed)
CSN: [id number]     Arrival date & time 06/13/15  0900 History  This chart was scribed for non-physician practitioner, Teressa Lower, NP, working with Jerelyn Scott, MD, by Ronney Lion, ED Scribe. This patient was seen in room TR05C/TR05C and the patient's care was started at 9:11 AM.    No chief complaint on file.  The history is provided by the patient. No language interpreter was used.     HPI Comments: [name] is a 24 y.o. female who presents to the Emergency Department complaining of a sore throat and sinus pressure that began yesterday. She also complains of a fever measured at 101 yesterday and an associated headache. She denies any known sick contact. Patient has not taken any medication for this. Patient denies any known allergies. She denies SOB.   Past Medical History  Diagnosis Date  . Asthma    Past Surgical History  Procedure Laterality Date  . Cholecystectomy    . Tonsillectomy     Family History  Problem Relation Age of Onset  . Anesthesia problems Neg Hx   . Hypotension Neg Hx   . Malignant hyperthermia Neg Hx   . Pseudochol deficiency Neg Hx    History  Substance Use Topics  . Smoking status: Never Smoker   . Smokeless tobacco: Never Used  . Alcohol Use: No   OB History    Gravida Para Term Preterm AB TAB SAB Ectopic Multiple Living   Review of Systems  Constitutional: Positive for fever.  HENT: Positive for sinus pressure and sore throat.   Neurological: Positive for headaches.  All other systems reviewed and are negative.     Allergies  Penicillins  Home Medications   Prior to Admission medications   Medication Sig Start Date End Date Taking? Authorizing Provider  amLODipine (NORVASC) 10 MG tablet Take 10 mg by mouth daily.    Historical Provider, MD  hydrochlorothiazide (HYDRODIURIL) 12.5 MG tablet Take 1 tablet (12.5 mg total) by mouth daily. 08/23/14   Fredirick Lathe, MD  ibuprofen (ADVIL,MOTRIN) 800 MG tablet  Take 1 tablet (800 mg total) by mouth 3 (three) times daily. 02/24/14   Sunnie Nielsen, MD  meloxicam (MOBIC) 15 MG tablet Take 15 mg by mouth daily as needed for pain.    Historical Provider, MD  metFORMIN (GLUCOPHAGE) 850 MG tablet Take 850 mg by mouth 3 (three) times daily.    Historical Provider, MD  predniSONE (DELTASONE) 10 MG tablet Take 4 tablets (40 mg total) by mouth daily. 05/07/15   Tilden Fossa, MD  traMADol (ULTRAM) 50 MG tablet Take 50 mg by mouth every 6 (six) hours as needed for moderate pain or severe pain.    Historical Provider, MD   There were no vitals taken for this visit. Physical Exam  Constitutional: She is oriented to person, place, and time. She appears well-developed and well-nourished. No distress.  HENT:  Head: Normocephalic and atraumatic.  Right Ear: External ear normal.  Left Ear: External ear normal.  Nose: Mucosal edema present. Right sinus exhibits maxillary sinus tenderness and frontal sinus tenderness. Left sinus exhibits maxillary sinus tenderness and frontal sinus tenderness.  Mouth/Throat: Posterior oropharyngeal edema and posterior oropharyngeal erythema present.  Eyes: Conjunctivae and EOM are normal.  Neck: Neck supple. No tracheal deviation present.  No meningeal symptoms  Cardiovascular: Normal rate.   Pulmonary/Chest: Effort normal. No respiratory distress.  Musculoskeletal: Normal range of motion.  Neurological: She is alert and oriented to person, place, and time.  Skin: Skin is warm and dry.  Psychiatric: She has a normal mood and affect. Her behavior is normal.  Nursing note and vitals reviewed.   ED Course  Procedures (including critical care time)  COORDINATION OF CARE: 9:11 AM - Discussed treatment plan with pt at bedside which includes OTC nasal spray and Rx antibiotics, and pt agreed to plan.  MDM   Final diagnoses:  Acute maxillary sinusitis, recurrence not specified   Will treat for sinusitis. Pt is okay to follow up as  needed. Given zithromax as has a pcn allergy  I personally performed the services described in this documentation, which was scribed in my presence. The recorded information has been reviewed and is accurate.    Teressa Lower, NP 06/13/15 [id number]  Jerelyn Scott, MD 06/13/15 1001

## 2015-09-09 ENCOUNTER — Telehealth: Payer: Self-pay | Admitting: Internal Medicine

## 2015-09-09 DIAGNOSIS — J45901 Unspecified asthma with (acute) exacerbation: Secondary | ICD-10-CM

## 2015-09-09 NOTE — Telephone Encounter (Signed)
Pt called and needs a refill on her inhaler called in. jw °

## 2015-09-09 NOTE — Telephone Encounter (Signed)
Patient previously using albuterol inhaler for asthma flare ups. Would ike a refill.

## 2015-09-12 MED ORDER — ALBUTEROL SULFATE HFA 108 (90 BASE) MCG/ACT IN AERS: 2.0000 | INHALATION_SPRAY | Freq: Four times a day (QID) | RESPIRATORY_TRACT | Status: DC | PRN

## 2015-09-12 NOTE — Telephone Encounter (Signed)
Refilled patient's albuterol.

## 2015-09-16 ENCOUNTER — Encounter: Payer: Self-pay | Admitting: Internal Medicine

## 2015-09-16 ENCOUNTER — Ambulatory Visit (INDEPENDENT_AMBULATORY_CARE_PROVIDER_SITE_OTHER): Payer: BLUE CROSS/BLUE SHIELD | Admitting: Internal Medicine

## 2015-09-16 VITALS — BP 148/97 | HR 85 | Temp 98.8°F | Ht 64.0 in | Wt 242.3 lb

## 2015-09-16 DIAGNOSIS — I10 Essential (primary) hypertension: Secondary | ICD-10-CM | POA: Diagnosis not present

## 2015-09-16 DIAGNOSIS — J45901 Unspecified asthma with (acute) exacerbation: Secondary | ICD-10-CM | POA: Diagnosis not present

## 2015-09-16 MED ORDER — CETIRIZINE HCL 10 MG PO TABS: 10.0000 mg | ORAL_TABLET | Freq: Every day | ORAL | Status: DC

## 2015-09-16 MED ORDER — ALBUTEROL SULFATE HFA 108 (90 BASE) MCG/ACT IN AERS: 2.0000 | INHALATION_SPRAY | Freq: Four times a day (QID) | RESPIRATORY_TRACT | Status: DC | PRN

## 2015-09-16 MED ORDER — FLUTICASONE PROPIONATE HFA 110 MCG/ACT IN AERO: 2.0000 | INHALATION_SPRAY | Freq: Every day | RESPIRATORY_TRACT | Status: DC

## 2015-09-16 MED ORDER — HYDROCHLOROTHIAZIDE 12.5 MG PO TABS: 25.0000 mg | ORAL_TABLET | Freq: Every day | ORAL | Status: DC

## 2015-09-16 NOTE — Assessment & Plan Note (Signed)
Elevated blood pressure in obese patient.  -  Discussed how we can lower blood pressure with diet and exercise, interested in talking more about this  -  Refill HCTZ, increased to max dose  -  Will get Lipid panel and BMET

## 2015-09-16 NOTE — Assessment & Plan Note (Signed)
Obesity - Not currently exercising, not eating well, interested in losing weight  - Patient to follow up with a visit to discuss weight

## 2015-09-16 NOTE — Progress Notes (Signed)
Patient ID: [name], female   DOB: 07/31/1991, 24 y.o.   MRN: [id number]   [facility] Noralee Chars, MD Phone: [phone]  Subjective:   # Asthma : Patient using Albuterol 3 times a day starting about 2  Months ago, states that she has a dry cough every night that wakes her up at about 3 am. Believes that it may be associated with starting work at [employer] (very dust), started bout  6 months. Went to the ED in June for Asthma exacerbation.   # HTN-  Has not taken HCTZ in 2 weeks. Diagnosised with HTN about a year ago. States even when she has been on her blood medication in that past, blood pressures have been in the 140s.    # Obese, BMI 44 - Exercises about once a month, has been having difficulty losing weight. Discussed setting up future appointment to discuss nutrition and weight.    All relevant systems were reviewed and were negative unless otherwise noted in the HPI  Past Medical History Reviewed problem list.  Medications- reviewed and updated Current Outpatient Prescriptions  Medication Sig Dispense Refill  . hydrochlorothiazide (HYDRODIURIL) 12.5 MG tablet Take 1 tablet (12.5 mg total) by mouth daily. 90 tablet 3  . albuterol (PROVENTIL HFA;VENTOLIN HFA) 108 (90 BASE) MCG/ACT inhaler Inhale 2 puffs into the lungs every 6 (six) hours as needed for wheezing or shortness of breath. 1 Inhaler 0  . [DISCONTINUED] promethazine (PHENERGAN) 25 MG tablet Take 1 tablet (25 mg total) by mouth every 6 (six) hours as needed for nausea or vomiting. (Patient not taking: Reported on 05/07/2015) 30 tablet 0   No current facility-administered medications for this visit.   Chief complaint-noted No additions to family history Social history- patient is a non- smoker  Objective: BP 148/97 mmHg  Pulse 85  Temp(Src) 98.8 F (37.1 C) (Oral)  Ht  (1.626 m)  Wt 242 lb 4.8 oz (109.907 kg)  BMI 41.57 kg/m2  LMP 09/06/2015 (Exact Date) Gen: NAD, alert,  cooperative with exam HEENT: NCAT, EOMI, PERRL, TMs nml  Neck: FROM, supple CV: RRR, good S1/S2, no murmur, cap refill <3 Resp: CTABL, no wheezes, non-labored Abd: SNTND, BS present, no guarding or organomegaly Ext: No edema, warm, normal tone, moves UE/LE spontaneously Neuro: Alert and oriented, No gross deficits Skin: no rashes no lesions  Assessment/Plan:  HTN (hypertension) Elevated blood pressure in obese patient.  -  Discussed how we can lower blood pressure with diet and exercise, interested in talking more about this  -  Refill HCTZ, increased to max dose  -  Will get Lipid panel and BMET     Asthma with acute exacerbation Asthma- Exacerbated by current work environment. Using Albuterol TID, night time cough etc. PE: oropharynx with cobblestoning - Refilled albuterol  - Started patient on Flovent daily, 2 puffs to help control asthma  - Cetrizine over-the counter for seasonal allergy control  - Follow up as needed for asthma    Severe obesity (BMI >= 40) Obesity - Not currently exercising, not eating well, interested in losing weight  - Patient to follow up with a visit to discuss weight

## 2015-09-16 NOTE — Assessment & Plan Note (Signed)
Asthma- Exacerbated by current work environment. Using Albuterol TID, night time cough etc. PE: oropharynx with cobblestoning - Refilled albuterol  - Started patient on Flovent daily, 2 puffs to help control asthma  - Cetrizine over-the counter for seasonal allergy control  - Follow up as needed for asthma

## 2015-09-16 NOTE — Patient Instructions (Addendum)
Will increase HCTZ dose to 25 mg. Will start you on Zyrtec for seasonal allergies. Will start you Flovent daily for asthma. Will want to come back for lab draw next. Follow up for removal of nexplanon and women's health visit  Asthma Asthma is a recurring condition in which the airways tighten and narrow. Asthma can make it difficult to breathe. It can cause coughing, wheezing, and shortness of breath. Asthma episodes, also called asthma attacks, range from minor to life-threatening. Asthma cannot be cured, but medicines and lifestyle changes can help control it. CAUSES Asthma is believed to be caused by inherited (genetic) and environmental factors, but its exact cause is unknown. Asthma may be triggered by allergens, lung infections, or irritants in the air. Asthma triggers are different for each person. Common triggers include:   Animal dander.  Dust mites.  Cockroaches.  Pollen from trees or grass.  Mold.  Smoke.  Air pollutants such as dust, household cleaners, hair sprays, aerosol sprays, paint fumes, strong chemicals, or strong odors.  Cold air, weather changes, and winds (which increase molds and pollens in the air).  Strong emotional expressions such as crying or laughing hard.  Stress.  Certain medicines (such as aspirin) or types of drugs (such as beta-blockers).  Sulfites in foods and drinks. Foods and drinks that may contain sulfites include dried fruit, potato chips, and sparkling grape juice.  Infections or inflammatory conditions such as the flu, a cold, or an inflammation of the nasal membranes (rhinitis).  Gastroesophageal reflux disease (GERD).  Exercise or strenuous activity. SYMPTOMS Symptoms may occur immediately after asthma is triggered or many hours later. Symptoms include:  Wheezing.  Excessive nighttime or early morning coughing.  Frequent or severe coughing with a common cold.  Chest tightness.  Shortness of breath. DIAGNOSIS  The diagnosis  of asthma is made by a review of your medical history and a physical exam. Tests may also be performed. These may include:  Lung function studies. These tests show how much air you breathe in and out.  Allergy tests.  Imaging tests such as X-rays. TREATMENT  Asthma cannot be cured, but it can usually be controlled. Treatment involves identifying and avoiding your asthma triggers. It also involves medicines. There are 2 classes of medicine used for asthma treatment:   Controller medicines. These prevent asthma symptoms from occurring. They are usually taken every day.  Reliever or rescue medicines. These quickly relieve asthma symptoms. They are used as needed and provide short-term relief. Your health care provider will help you create an asthma action plan. An asthma action plan is a written plan for managing and treating your asthma attacks. It includes a list of your asthma triggers and how they may be avoided. It also includes information on when medicines should be taken and when their dosage should be changed. An action plan may also involve the use of a device called a peak flow meter. A peak flow meter measures how well the lungs are working. It helps you monitor your condition. HOME CARE INSTRUCTIONS   Take medicines only as directed by your health care provider. Speak with your health care provider if you have questions about how or when to take the medicines.  Use a peak flow meter as directed by your health care provider. Record and keep track of readings.  Understand and use the action plan to help minimize or stop an asthma attack without needing to seek medical care.  Control your home environment in the following ways  to help prevent asthma attacks:  Do not smoke. Avoid being exposed to secondhand smoke.  Change your heating and air conditioning filter regularly.  Limit your use of fireplaces and wood stoves.  Get rid of pests (such as roaches and mice) and their  droppings.  Throw away plants if you see mold on them.  Clean your floors and dust regularly. Use unscented cleaning products.  Try to have someone else vacuum for you regularly. Stay out of rooms while they are being vacuumed and for a short while afterward. If you vacuum, use a dust mask from a hardware store, a double-layered or microfilter vacuum cleaner bag, or a vacuum cleaner with a HEPA filter.  Replace carpet with wood, tile, or vinyl flooring. Carpet can trap dander and dust.  Use allergy-proof pillows, mattress covers, and box spring covers.  Wash bed sheets and blankets every week in hot water and dry them in a dryer.  Use blankets that are made of polyester or cotton.  Clean bathrooms and kitchens with bleach. If possible, have someone repaint the walls in these rooms with mold-resistant paint. Keep out of the rooms that are being cleaned and painted.  Wash hands frequently. SEEK MEDICAL CARE IF:   You have wheezing, shortness of breath, or a cough even if taking medicine to prevent attacks.  The colored mucus you cough up (sputum) is thicker than usual.  Your sputum changes from clear or white to yellow, green, gray, or bloody.  You have any problems that may be related to the medicines you are taking (such as a rash, itching, swelling, or trouble breathing).  You are using a reliever medicine more than 2-3 times per week.  Your peak flow is still at 50-79% of your personal best after following your action plan for 1 hour.  You have a fever. SEEK IMMEDIATE MEDICAL CARE IF:   You seem to be getting worse and are unresponsive to treatment during an asthma attack.  You are short of breath even at rest.  You get short of breath when doing very little physical activity.  You have difficulty eating, drinking, or talking due to asthma symptoms.  You develop chest pain.  You develop a fast heartbeat.  You have a bluish color to your lips or fingernails.  You  are light-headed, dizzy, or faint.  Your peak flow is less than 50% of your personal best. MAKE SURE YOU:   Understand these instructions.  Will watch your condition.  Will get help right away if you are not doing well or get worse. Document Released: 12/17/2005 Document Revised: 05/03/2014 Document Reviewed: 07/16/2013 [facility] Patient Information 2015 [place], [place]. This information is not intended to replace advice given to you by your health care provider. Make sure you discuss any questions you have with your health care provider.

## 2015-09-22 ENCOUNTER — Ambulatory Visit: Payer: BLUE CROSS/BLUE SHIELD

## 2015-09-26 ENCOUNTER — Encounter: Payer: Self-pay | Admitting: Internal Medicine

## 2015-09-26 ENCOUNTER — Ambulatory Visit (INDEPENDENT_AMBULATORY_CARE_PROVIDER_SITE_OTHER): Payer: BLUE CROSS/BLUE SHIELD | Admitting: Internal Medicine

## 2015-09-26 ENCOUNTER — Other Ambulatory Visit: Payer: BLUE CROSS/BLUE SHIELD

## 2015-09-26 VITALS — BP 155/97 | HR 84 | Temp 99.1°F | Ht 64.0 in | Wt 242.0 lb

## 2015-09-26 DIAGNOSIS — I10 Essential (primary) hypertension: Secondary | ICD-10-CM

## 2015-09-26 DIAGNOSIS — IMO0001 Reserved for inherently not codable concepts without codable children: Secondary | ICD-10-CM | POA: Insufficient documentation

## 2015-09-26 DIAGNOSIS — Z309 Encounter for contraceptive management, unspecified: Secondary | ICD-10-CM

## 2015-09-26 DIAGNOSIS — J45901 Unspecified asthma with (acute) exacerbation: Secondary | ICD-10-CM | POA: Diagnosis not present

## 2015-09-26 DIAGNOSIS — Z30432 Encounter for removal of intrauterine contraceptive device: Secondary | ICD-10-CM | POA: Diagnosis not present

## 2015-09-26 LAB — LIPID PANEL
Cholesterol: 176 mg/dL (ref 125–200)
HDL: 52 mg/dL (ref >=46)
LDL CALC: 108 mg/dL (ref <130)
TRIGLYCERIDES: 80 mg/dL (ref <150)
Total CHOL/HDL Ratio: 3.4 Ratio (ref <=5.0)
VLDL: 16 mg/dL (ref <30)

## 2015-09-26 LAB — BASIC METABOLIC PANEL WITH GFR
BUN: 12 mg/dL (ref 7–25)
CALCIUM: 9.2 mg/dL (ref 8.6–10.2)
CO2: 26 mmol/L (ref 20–31)
CREATININE: 0.88 mg/dL (ref 0.50–1.10)
Chloride: 102 mmol/L (ref 98–110)
GFR, Est Non African American: >89 mL/min (ref >=60)
Glucose, Bld: 86 mg/dL (ref 65–99)
Potassium: 4.2 mmol/L (ref 3.5–5.3)
SODIUM: 132 mmol/L — AB (ref 135–146)

## 2015-09-26 MED ORDER — LISINOPRIL 10 MG PO TABS: 10.0000 mg | ORAL_TABLET | Freq: Every day | ORAL | Status: DC

## 2015-09-26 MED ORDER — NORGESTIM-ETH ESTRAD TRIPHASIC 0.18/0.215/0.25 MG-35 MCG PO TABS: 1.0000 | ORAL_TABLET | Freq: Every day | ORAL | Status: DC

## 2015-09-26 NOTE — Assessment & Plan Note (Signed)
Obesity : No breakfast meal, lunch: chick-fil sandwhich, Dinner: Steak and Macrooni. Will see patient back in 2 weeks to discuss weight.   - Goal: Will decrease daily sweet to twice weekly, will eat fruit instead at night for sweet over the next 2 weeks.  -   Will see patient back in 2 weeks for follow up

## 2015-09-26 NOTE — Assessment & Plan Note (Addendum)
Asthma, Controlled. Patient using albuterol inhaler about once a week now.  - Continue Flovent

## 2015-09-26 NOTE — Patient Instructions (Signed)
We will see you back in two weeks to discuss ways to lose weight. We will also need to recheck blood pressure at next visit as well.   Incision Care An incision is when a surgeon cuts into your body tissues. After surgery, the incision needs to be cared for properly to prevent infection.  HOME CARE INSTRUCTIONS   Take all medicine as directed by your caregiver. Only take over-the-counter or prescription medicines for pain, discomfort, or fever as directed by your caregiver.  Do not remove your bandage (dressing) or get your incision wet until your surgeon gives you permission. In the event that your dressing becomes wet, dirty, or starts to smell, change the dressing and call your surgeon for instructions as soon as possible.  Take showers. Do not take tub baths, swim, or do anything that may soak the wound until it is healed.  Resume your normal diet and activities as directed or allowed.  Avoid lifting any weight until you are instructed otherwise.  Use anti-itch antihistamine medicine as directed by your caregiver. The wound may itch when it is healing. Do not pick or scratch at the wound.  Follow up with your caregiver for stitch (suture) or staple removal as directed.  Drink enough fluids to keep your urine clear or pale yellow. SEEK MEDICAL CARE IF:   You have redness, swelling, or increasing pain in the wound that is not controlled with medicine.  You have drainage, blood, or pus coming from the wound that lasts longer than 1 day.  You develop muscle aches, chills, or a general ill feeling.  You notice a bad smell coming from the wound or dressing.  Your wound edges separate after the sutures, staples, or skin adhesive strips have been removed.  You develop persistent nausea or vomiting. SEEK IMMEDIATE MEDICAL CARE IF:   You have a fever.  You develop a rash.  You develop dizzy episodes or faint while standing.  You have difficulty breathing.  You develop any  reaction or side effects to medicine given. MAKE SURE YOU:   Understand these instructions.  Will watch your condition.  Will get help right away if you are not doing well or get worse. Document Released: 07/06/2005 Document Revised: 03/10/2012 Document Reviewed: 02/10/2014 [facility] Patient Information 2015 [place], [place]. This information is not intended to replace advice given to you by your health care Smera Guyette. Make sure you discuss any questions you have with your health care Arriyana Rodell.

## 2015-09-26 NOTE — Progress Notes (Signed)
Patient ID: [name], female   DOB: 1991-03-06, 24 y.o.   MRN: [id number]   [facility] Noralee Chars, MD Phone: [phone]  Subjective:   # Nexplanon Removal: Nexplanon expired, patient wants to get it out.Has not been using any type of birth control at this point. Patient wants to start daily birth control. Does not smoke, no hx of clots   # Obesity: Lunch: Chick-fil sandwich. Dinner: steak an macaroni and cheese, Sweets daily : Carrot cake. Patient does not exercise, has a gym membership,  30 minutes on the treadmill yesterday  # HTN: Has been taking medication including this morning. However has noted elevated blood pressure when checking at home including 150/90   All relevant systems were reviewed and were negative unless otherwise noted in the HPI  Past Medical History Reviewed problem list.  Medications- reviewed and updated Current Outpatient Prescriptions  Medication Sig Dispense Refill  . albuterol (PROVENTIL HFA;VENTOLIN HFA) 108 (90 BASE) MCG/ACT inhaler Inhale 2 puffs into the lungs every 6 (six) hours as needed for wheezing or shortness of breath. 1 Inhaler 0  . cetirizine (ZYRTEC) 10 MG tablet Take 1 tablet (10 mg total) by mouth at bedtime. 30 tablet 3  . fluticasone (FLOVENT HFA) 110 MCG/ACT inhaler Inhale 2 puffs into the lungs daily before breakfast. 1 Inhaler 12  . hydrochlorothiazide (HYDRODIURIL) 12.5 MG tablet Take 2 tablets (25 mg total) by mouth daily. 60 tablet 3  . lisinopril (PRINIVIL,ZESTRIL) 10 MG tablet Take 1 tablet (10 mg total) by mouth daily. 90 tablet 3  . Norgestimate-Ethinyl Estradiol Triphasic (ORTHO TRI-CYCLEN, 28,) 0.18/0.215/0.25 MG-35 MCG tablet Take 1 tablet by mouth daily. 1 Package 11  . [DISCONTINUED] promethazine (PHENERGAN) 25 MG tablet Take 1 tablet (25 mg total) by mouth every 6 (six) hours as needed for nausea or vomiting. (Patient not taking: Reported on 05/07/2015) 30 tablet 0   No current  facility-administered medications for this visit.   Chief complaint-noted No additions to family history Social history- patient is a non smoker  Objective: BP 155/97 mmHg  Pulse 84  Temp(Src) 99.1 F (37.3 C) (Oral)  Ht  (1.626 m)  Wt 242 lb (109.77 kg)  BMI 41.52 kg/m2  LMP 09/06/2015 (Exact Date) Gen: NAD, alert, cooperative with exam Neck: FROM, supple CV: RRR, good S1/S2, no murmur, cap refill <3 Resp: CTABL, no wheezes, non-labored Neuro: Alert and oriented, No gross deficits Skin: no rashes no lesions  Nexplanon Removal Patient given informed consent for removal of her Nexplanon.  Signed copy in the chart.  Appropriate time out taken. Nexplanon site identified.  Area prepped in usual sterile fashon. One cc of 1% lidocaine was used to anesthetize the area at the distal end of the implant. A small stab incision was made right beside the implant on the distal portion.  The Nexplanon rod was grasped using hemostats and removed without difficulty.  There was less than 3 cc blood loss. There were no complications.  Steri-strips were applied over the small incision.  A pressure bandage was applied to reduce any bruising.  The patient tolerated the procedure well and was given post procedure instructions.  Assessment/Plan: See problem based a/p  HTN (hypertension) HTN - Blood pressure elevated per patient at home and today at visit  - Continue 25 mg HCTZ   - Will start patient on Lisinopril 10 mg  - BMET and lipid panel today   Asthma with acute exacerbation Asthma, Controlled. Patient using albuterol inhaler  about once a week now.  - Continue Flovent   Severe obesity (BMI >= 40) Obesity : No breakfast meal, lunch: chick-fil sandwhich, Dinner: Steak and Macrooni. Will see patient back in 2 weeks to discuss weight.   - Goal: Will decrease daily sweet to twice weekly, will eat fruit instead at night for sweet over the next 2 weeks.  -   Will see patient back in 2 weeks for  follow up   Birth control Birth Control  - Removed expired Nexplanon today - Patient starting oral pills today per preference

## 2015-09-26 NOTE — Assessment & Plan Note (Addendum)
HTN - Blood pressure elevated per patient at home and today at visit  - Continue 25 mg HCTZ   - Will start patient on Lisinopril 10 mg  - BMET and lipid panel today

## 2015-09-26 NOTE — Progress Notes (Signed)
Bmp,flp done today [facility] holder

## 2015-09-26 NOTE — Assessment & Plan Note (Signed)
Birth Control  - Removed expired Nexplanon today - Patient starting oral pills today per preference

## 2015-10-27 ENCOUNTER — Other Ambulatory Visit: Payer: Self-pay | Admitting: *Deleted

## 2015-10-28 MED ORDER — ALBUTEROL SULFATE HFA 108 (90 BASE) MCG/ACT IN AERS: 2.0000 | INHALATION_SPRAY | Freq: Four times a day (QID) | RESPIRATORY_TRACT | Status: DC | PRN

## 2015-10-28 NOTE — Telephone Encounter (Signed)
2nd request.  Martin, Tamika L, RN  

## 2015-11-01 ENCOUNTER — Encounter: Payer: Self-pay | Admitting: Internal Medicine

## 2015-11-15 ENCOUNTER — Other Ambulatory Visit: Payer: Self-pay | Admitting: *Deleted

## 2015-11-16 MED ORDER — NORGESTIM-ETH ESTRAD TRIPHASIC 0.18/0.215/0.25 MG-35 MCG PO TABS: 1.0000 | ORAL_TABLET | Freq: Every day | ORAL | Status: DC

## 2015-11-17 ENCOUNTER — Ambulatory Visit (INDEPENDENT_AMBULATORY_CARE_PROVIDER_SITE_OTHER): Payer: BLUE CROSS/BLUE SHIELD | Admitting: Family Medicine

## 2015-11-17 VITALS — BP 145/91 | HR 91 | Temp 98.9°F | Ht 64.0 in | Wt 241.0 lb

## 2015-11-17 DIAGNOSIS — Z30019 Encounter for initial prescription of contraceptives, unspecified: Secondary | ICD-10-CM | POA: Diagnosis not present

## 2015-11-17 DIAGNOSIS — Z3009 Encounter for other general counseling and advice on contraception: Secondary | ICD-10-CM | POA: Diagnosis not present

## 2015-11-17 DIAGNOSIS — Z3040 Encounter for surveillance of contraceptives, unspecified: Secondary | ICD-10-CM

## 2015-11-17 LAB — POCT URINE PREGNANCY: Preg Test, Ur: NEGATIVE

## 2015-11-17 MED ORDER — ETONOGESTREL 68 MG ~~LOC~~ IMPL
68.0000 mg | DRUG_IMPLANT | Freq: Once | SUBCUTANEOUS | Status: AC
  Administered 2015-11-17: 68 mg via SUBCUTANEOUS

## 2015-11-17 NOTE — Progress Notes (Signed)
Patient ID: [name], female   DOB: August 04, 1991, 24 y.o.   MRN: [id number] PROCEDURE NOTE: NEXPLANON  INSERTION Patient given informed consent and signed copy in the chart.  Pregnancy test was  negative  Appropriate time out was taken. LEFT  arm was prepped and draped in the usual sterile fashion. Appropriate measurement was made for insertion of nexplanon and landmarks identified, insertion site marked. Two cc of 1%lidocaine  was used for local anesthesia. Once anesthesia obtained, nexplanon was inserted in typical fashion. No complications. Pressure bandage applied to decrease bruising. Patient given follow up instructions should she experience redness, swelling at sight or fever in the next 24 hours. Patient given Nexplanon pocket card.

## 2015-11-17 NOTE — Addendum Note (Signed)
Addended by: Jone BasemanFLEEGER, Shloimy Michalski D on: 11/17/2015 11:24 AM   Modules accepted: Orders

## 2015-11-17 NOTE — Patient Instructions (Signed)
Let us know if you have any issues. Great to see you!

## 2016-10-25 ENCOUNTER — Encounter: Payer: Self-pay | Admitting: Internal Medicine

## 2016-10-25 ENCOUNTER — Ambulatory Visit (INDEPENDENT_AMBULATORY_CARE_PROVIDER_SITE_OTHER): Payer: BLUE CROSS/BLUE SHIELD | Admitting: Internal Medicine

## 2016-10-25 VITALS — BP 166/98 | HR 111 | Temp 99.7°F | Ht 64.0 in | Wt 253.6 lb

## 2016-10-25 DIAGNOSIS — J069 Acute upper respiratory infection, unspecified: Secondary | ICD-10-CM

## 2016-10-25 DIAGNOSIS — B309 Viral conjunctivitis, unspecified: Secondary | ICD-10-CM | POA: Diagnosis not present

## 2016-10-25 DIAGNOSIS — B9789 Other viral agents as the cause of diseases classified elsewhere: Secondary | ICD-10-CM

## 2016-10-25 MED ORDER — PHENOL 1.4 % MT LIQD: 1.0000 | Freq: Four times a day (QID) | OROMUCOSAL | 0 refills | Status: DC | PRN

## 2016-10-25 MED ORDER — HYDROCHLOROTHIAZIDE 12.5 MG PO TABS: 25.0000 mg | ORAL_TABLET | Freq: Every day | ORAL | 3 refills | Status: DC

## 2016-10-25 MED ORDER — CETIRIZINE HCL 10 MG PO TABS: 10.0000 mg | ORAL_TABLET | Freq: Every day | ORAL | 3 refills | Status: DC

## 2016-10-25 MED ORDER — LISINOPRIL 10 MG PO TABS: 10.0000 mg | ORAL_TABLET | Freq: Every day | ORAL | 3 refills | Status: DC

## 2016-10-25 NOTE — Progress Notes (Deleted)
   [facility] Phone: [phone]   Date of Visit: 10/25/2016   HPI:  [name] is a 25 y.o. female presenting to clinic today for same day appointment. PCP: Stephanie MaiersAsiyah Z Mikell, MD Concerns today include:  Sinus Symptoms/Concern for pink eye:    ROS: See HPI.  PMFSH:  Allergic Rhinitis  Eczema Asthma  PHYSICAL EXAM: There were no vitals taken for this visit. Gen: *** HEENT: *** Heart: *** Lungs: *** Neuro: *** Ext: ***  ASSESSMENT/PLAN:  Health maintenance:  -***  No problem-specific Assessment & Plan notes found for this encounter.  FOLLOW UP: Follow up in *** for ***  Palma HolterKanishka G Gunadasa, MD PGY 2 [facility]

## 2016-10-25 NOTE — Patient Instructions (Signed)
Your symptoms are likely due to a viral infection. If your symptoms improve and suddenly worsen, please return to clinic. If you have shortness of breath or wheezing not controlled by Albuterol please return to clinic.

## 2016-10-25 NOTE — Progress Notes (Signed)
   [facility] Phone: [phone]   Date of Visit: 10/25/2016   HPI:  Michiel SitesCharrise N Pichon is a 25 y.o. female presenting to clinic today for same day appointment. PCP: Danella MaiersAsiyah Z Mikell, MD Concerns today include:  - sore throat started yesterday  - persistent cough x 2 weeks; stable not resolving; productive cough yellow mucus; no shortness of breath/wheezing - sinus pressure with post-nasal drip. No sinus pain. Clear rhinorrhea. No fevers. Reports of chills which started today; patient reports she is pretty hot natured. Symptoms are stable but not resolving  - daughter is sick with similar symptoms  - Right eye dark red which started about 3 days ago. Report some tearing only on the right side. No puss. No pain or itching. No blurred vision  - dayquil not helping  - not taking Zyrtec because patient reports she is not having flareups  - albuterol use about 3 days a week  - not using Flovent because of price   - of note blood pressure is elevated today. Patient has been out of medications for about 2 weeks. Denies chest pain, shortness of breath, blurred vision, Headache.  ROS: See HPI.  PMFSH:    PHYSICAL EXAM: BP (!) 166/98 (BP Location: Left Arm, Patient Position: Sitting, Cuff Size: Normal) Comment: patient has been out of blood pressure meds for 2 weeks  Pulse (!) 111   Temp 99.7 F (37.6 C) (Oral)   Ht 5\' 4"  (1.626 m)   Wt 253 lb 9.6 oz (115 kg)   BMI 43.53 kg/m  GEN: NAD HEENT:  conjunctival erythema of the right eye but without discharge. Some tearing of the right eye. PERRL. EMOI.  Pharynx with mild erythema and without exudates. Nasal turbinates with mild erythmea and no discharge. No sinus tenderness to palpation. TMs normal bilaterally. No cervical lymphadenopathy.  CV: RRR, no murmurs, rubs, or gallops PULM: CTAB, normal effort SKIN: No rash or cyanosis; warm and well-perfused   ASSESSMENT/PLAN:  Viral Conjunctivitis:  Likely viral in  etiology in the setting of URI symptoms. Not consistent with bacterial conjunctivitis.   Viral URI:  No signs or symptoms of pneumonia. Return precautions discussed.  Cholrseptic spray provided PRN for sore throat   Refilled BP medications.    Palma HolterKanishka G Page Pucciarelli, MD PGY 2 [facility]

## 2016-10-29 ENCOUNTER — Encounter (HOSPITAL_COMMUNITY): Payer: Self-pay | Admitting: Emergency Medicine

## 2016-10-29 ENCOUNTER — Emergency Department (HOSPITAL_COMMUNITY)
Admission: EM | Admit: 2016-10-29 | Discharge: 2016-10-30 | Disposition: A | Discharge status: Home / Self Care | Payer: No Typology Code available for payment source | Attending: Emergency Medicine | Admitting: Emergency Medicine

## 2016-10-29 ENCOUNTER — Emergency Department (HOSPITAL_COMMUNITY): Payer: No Typology Code available for payment source

## 2016-10-29 DIAGNOSIS — J45909 Unspecified asthma, uncomplicated: Secondary | ICD-10-CM | POA: Insufficient documentation

## 2016-10-29 DIAGNOSIS — J069 Acute upper respiratory infection, unspecified: Secondary | ICD-10-CM | POA: Diagnosis not present

## 2016-10-29 DIAGNOSIS — H6691 Otitis media, unspecified, right ear: Secondary | ICD-10-CM | POA: Insufficient documentation

## 2016-10-29 DIAGNOSIS — I10 Essential (primary) hypertension: Secondary | ICD-10-CM | POA: Insufficient documentation

## 2016-10-29 DIAGNOSIS — H6690 Otitis media, unspecified, unspecified ear: Secondary | ICD-10-CM

## 2016-10-29 DIAGNOSIS — R05 Cough: Secondary | ICD-10-CM | POA: Diagnosis present

## 2016-10-29 HISTORY — DX: Obesity, unspecified: E66.9

## 2016-10-29 LAB — CBC WITH DIFFERENTIAL/PLATELET
BASOS ABS: 0.1 10*3/uL (ref 0.0–0.1)
Basophils Relative: 1 %
EOS ABS: 0 10*3/uL (ref 0.0–0.7)
Eosinophils Relative: 0 %
HEMATOCRIT: 41.6 % (ref 36.0–46.0)
Hemoglobin: 14.1 g/dL (ref 12.0–15.0)
Lymphocytes Relative: 15 %
Lymphs Abs: 1.8 10*3/uL (ref 0.7–4.0)
MCH: 26.9 pg (ref 26.0–34.0)
MCHC: 33.9 g/dL (ref 30.0–36.0)
MCV: 79.2 fL (ref 78.0–100.0)
MONOS PCT: 10 %
Monocytes Absolute: 1.2 10*3/uL — ABNORMAL HIGH (ref 0.1–1.0)
NEUTROS ABS: 9.1 10*3/uL — AB (ref 1.7–7.7)
Neutrophils Relative %: 74 %
Platelets: 414 10*3/uL — ABNORMAL HIGH (ref 150–400)
RBC: 5.25 MIL/uL — AB (ref 3.87–5.11)
RDW: 14.5 % (ref 11.5–15.5)
WBC: 12.2 10*3/uL — AB (ref 4.0–10.5)

## 2016-10-29 LAB — URINALYSIS, ROUTINE W REFLEX MICROSCOPIC
Bilirubin Urine: NEGATIVE
GLUCOSE, UA: NEGATIVE mg/dL
Hgb urine dipstick: NEGATIVE
Ketones, ur: NEGATIVE mg/dL
Nitrite: NEGATIVE
PROTEIN: NEGATIVE mg/dL
Specific Gravity, Urine: 1.019 (ref 1.005–1.030)
pH: 6 (ref 5.0–8.0)

## 2016-10-29 LAB — COMPREHENSIVE METABOLIC PANEL
ALBUMIN: 3.8 g/dL (ref 3.5–5.0)
ALT: 17 U/L (ref 14–54)
AST: 21 U/L (ref 15–41)
Alkaline Phosphatase: 87 U/L (ref 38–126)
Anion gap: 12 (ref 5–15)
BILIRUBIN TOTAL: 0.5 mg/dL (ref 0.3–1.2)
BUN: 7 mg/dL (ref 6–20)
CO2: 22 mmol/L (ref 22–32)
Calcium: 9.6 mg/dL (ref 8.9–10.3)
Chloride: 101 mmol/L (ref 101–111)
Creatinine, Ser: 1.03 mg/dL — ABNORMAL HIGH (ref 0.44–1.00)
GFR calc Af Amer: >60 mL/min (ref >60)
GFR calc non Af Amer: >60 mL/min (ref >60)
GLUCOSE: 115 mg/dL — AB (ref 65–99)
POTASSIUM: 3.4 mmol/L — AB (ref 3.5–5.1)
SODIUM: 135 mmol/L (ref 135–145)
Total Protein: 8.1 g/dL (ref 6.5–8.1)

## 2016-10-29 LAB — POC URINE PREG, ED: Preg Test, Ur: NEGATIVE

## 2016-10-29 LAB — URINE MICROSCOPIC-ADD ON: RBC / HPF: NONE SEEN RBC/hpf (ref 0–5)

## 2016-10-29 MED ORDER — ACETAMINOPHEN 325 MG PO TABS
650.0000 mg | ORAL_TABLET | Freq: Once | ORAL | Status: AC
  Administered 2016-10-29: 650 mg via ORAL

## 2016-10-29 MED ORDER — ONDANSETRON 4 MG PO TBDP: 4.0000 mg | ORAL_TABLET | Freq: Three times a day (TID) | ORAL | 0 refills | Status: DC | PRN

## 2016-10-29 MED ORDER — ACETAMINOPHEN 325 MG PO TABS
ORAL_TABLET | ORAL | Status: AC
  Administered 2016-10-29: 650 mg via ORAL
  Filled 2016-10-29: qty 2

## 2016-10-29 MED ORDER — BENZONATATE 100 MG PO CAPS: 100.0000 mg | ORAL_CAPSULE | Freq: Three times a day (TID) | ORAL | 0 refills | Status: DC

## 2016-10-29 MED ORDER — AMOXICILLIN 500 MG PO CAPS: 500.0000 mg | ORAL_CAPSULE | Freq: Two times a day (BID) | ORAL | 0 refills | Status: AC

## 2016-10-29 NOTE — ED Triage Notes (Signed)
Pt. reports persistent productive cough with nasal congestion /runny nose , chest congestion onset last week , seen by her PCP last week diagnosed with viral illness. Febrile at triage with chills.

## 2016-10-29 NOTE — ED Provider Notes (Signed)
[facility]-EMERGENCY DEPT Provider Note   CSN: [id number] Arrival date & time: 10/29/16  2035  By signing my name below, I, [name], attest that this documentation has been prepared under the direction and in the presence of Irja Wheless, PA-C. Electronically Signed: Phillis HaggisGabriella Marks, ED Scribe. 10/29/16. 11:43 PM.  History   Chief Complaint Chief Complaint  Patient presents with  . Cough  . Nasal Congestion   The history is provided by the patient. No language interpreter was used.   HPI Comments: [name] is a 25 y.o. female with a hx of asthma and HTN who presents to the Emergency Department complaining of persistent productive cough onset 3 weeks ago. Pt reports associated nasal congestion, sinus pressure, right ear pain, rhinorrhea, decreased appetite, chills, and fever tmax 101.6 F, all of which developed three days ago. Pt was seen by her PCP last week and diagnosed with a viral illness. Pt has been using Theraflu, Mucinex Cold and Flu, and hot tea for her symptoms to no relief. Pt does not have allergies to medications. Pt says that she takes Lisinopril for her HTN, but has not been able to take the medication because she has been "vomiting from decreased appetite." She denies SOB, chest pain, headache, vision changes, or any other complaints.      Past Medical History:  Diagnosis Date  . Asthma   . Obesity     Patient Active Problem List   Diagnosis Date Noted  . Birth control 09/26/2015  . HTN (hypertension) 09/16/2015  . Severe obesity (BMI >= 40) (HCC) 09/16/2015  . ECZEMA 06/07/2008  . MIGRAINE, UNSPEC., W/O INTRACTABLE MIGRAINE 02/27/2007  . RHINITIS, ALLERGIC 02/27/2007  . Asthma with acute exacerbation 02/27/2007    Past Surgical History:  Procedure Laterality Date  . CHOLECYSTECTOMY    . TONSILLECTOMY      OB History    Gravida Para Term Preterm AB Living   1 1   1   1    SAB TAB Ectopic Multiple Live Births           1     Home Medications      Prior to Admission medications   Medication Sig Start Date End Date Taking? Authorizing Provider  albuterol (PROAIR HFA) 108 (90 BASE) MCG/ACT inhaler Inhale 2 puffs into the lungs every 6 (six) hours as needed for wheezing or shortness of breath. 10/28/15   Asiyah Mayra ReelZahra Mikell, MD  albuterol (PROVENTIL HFA;VENTOLIN HFA) 108 (90 BASE) MCG/ACT inhaler Inhale 2 puffs into the lungs every 6 (six) hours as needed for wheezing or shortness of breath. 09/16/15   Asiyah Mayra ReelZahra Mikell, MD  amoxicillin (AMOXIL) 500 MG capsule Take 1 capsule (500 mg total) by mouth 2 (two) times daily. 10/29/16 11/08/16  Taylan Mayhan C Lyndie Vanderloop, PA-C  benzonatate (TESSALON) 100 MG capsule Take 1 capsule (100 mg total) by mouth every 8 (eight) hours. 10/29/16   Jaz Laningham C Thurman Sarver, PA-C  cetirizine (ZYRTEC) 10 MG tablet Take 1 tablet (10 mg total) by mouth at bedtime. 10/25/16   Palma HolterKanishka G Gunadasa, MD  fluticasone (FLOVENT HFA) 110 MCG/ACT inhaler Inhale 2 puffs into the lungs daily before breakfast. 09/16/15   Asiyah Mayra ReelZahra Mikell, MD  hydrochlorothiazide (HYDRODIURIL) 12.5 MG tablet Take 2 tablets (25 mg total) by mouth daily. 10/25/16   Palma HolterKanishka G Gunadasa, MD  lisinopril (PRINIVIL,ZESTRIL) 10 MG tablet Take 1 tablet (10 mg total) by mouth daily. 10/25/16   Palma HolterKanishka G Gunadasa, MD  Norgestimate-Ethinyl Estradiol Triphasic (ORTHO TRI-CYCLEN, 28,)  0.18/0.215/0.25 MG-35 MCG tablet Take 1 tablet by mouth daily. 11/16/15   Asiyah Mayra ReelZahra Mikell, MD  ondansetron (ZOFRAN ODT) 4 MG disintegrating tablet Take 1 tablet (4 mg total) by mouth every 8 (eight) hours as needed for nausea or vomiting. 10/29/16   Kamyrah Feeser C Ryzen Deady, PA-C  phenol (CHLORASEPTIC) 1.4 % LIQD Use as directed 1 spray in the mouth or throat every 6 (six) hours as needed for throat irritation / pain. 10/25/16   Palma HolterKanishka G Gunadasa, MD    Family History Family History  Problem Relation Age of Onset  . Anesthesia problems Neg Hx   . Hypotension Neg Hx   . Malignant hyperthermia Neg Hx   .  Pseudochol deficiency Neg Hx     Social History Social History  Substance Use Topics  . Smoking status: Never Smoker  . Smokeless tobacco: Never Used  . Alcohol use No     Allergies   Review of patient's allergies indicates no known allergies.   Review of Systems Review of Systems  Constitutional: Positive for appetite change, chills and fever.  HENT: Positive for congestion, ear pain, rhinorrhea and sinus pressure. Negative for trouble swallowing.   Respiratory: Positive for cough. Negative for shortness of breath.   Cardiovascular: Negative for chest pain.  Gastrointestinal: Negative for abdominal pain.  All other systems reviewed and are negative.  Physical Exam Updated Vital Signs BP (!) 170/101 (BP Location: Left Arm)   Pulse 94   Temp 101.6 F (38.7 C) (Oral)   Resp 16   Ht 5\' 4"  (1.626 m)   Wt 250 lb (113.4 kg)   SpO2 94%   BMI 42.91 kg/m   Physical Exam  Constitutional: She appears well-developed and well-nourished. No distress.  HENT:  Head: Normocephalic and atraumatic.  Right Ear: Tympanic membrane is erythematous.  Left Ear: Tympanic membrane normal.  Mouth/Throat: Uvula is midline. Posterior oropharyngeal edema and posterior oropharyngeal erythema present.  Eyes: Conjunctivae are normal.  Neck: Neck supple.  Cardiovascular: Normal rate, regular rhythm, normal heart sounds and intact distal pulses.   Pulmonary/Chest: Effort normal and breath sounds normal. No respiratory distress.  Abdominal: Soft. There is no tenderness. There is no guarding.  Musculoskeletal: She exhibits no edema.  Lymphadenopathy:    She has no cervical adenopathy.  Neurological: She is alert.  Skin: Skin is warm and dry. She is not diaphoretic.  Psychiatric: She has a normal mood and affect. Her behavior is normal.  Nursing note and vitals reviewed.  ED Treatments / Results  DIAGNOSTIC STUDIES: Oxygen Saturation is 94% on RA, adequate by my interpretation.    COORDINATION  OF CARE: 11:39 PM-Discussed treatment plan which includes labs, chest x-ray, amoxicillin, and Tessalon pearls with pt at bedside and pt agreed to plan.    Labs (all labs ordered are listed, but only abnormal results are displayed) Labs Reviewed  CBC WITH DIFFERENTIAL/PLATELET - Abnormal; Notable for the following:       Result Value   WBC 12.2 (*)    RBC 5.25 (*)    Platelets 414 (*)    Neutro Abs 9.1 (*)    Monocytes Absolute 1.2 (*)    All other components within normal limits  COMPREHENSIVE METABOLIC PANEL - Abnormal; Notable for the following:    Potassium 3.4 (*)    Glucose, Bld 115 (*)    Creatinine, Ser 1.03 (*)    All other components within normal limits  URINALYSIS, ROUTINE W REFLEX MICROSCOPIC (NOT [facility]) - Abnormal; Notable for  the following:    APPearance CLOUDY (*)    Leukocytes, UA SMALL (*)    All other components within normal limits  URINE MICROSCOPIC-ADD ON - Abnormal; Notable for the following:    Squamous Epithelial / LPF 6-30 (*)    Bacteria, UA RARE (*)    All other components within normal limits  POC URINE PREG, ED    EKG  EKG Interpretation None       Radiology Dg Chest 2 View  Result Date: 10/29/2016 CLINICAL DATA:  Midsternal chest pain, onset this morning. Febrile today, with productive cough. EXAM: CHEST  2 VIEW COMPARISON:  05/07/2015 FINDINGS: The heart size and mediastinal contours are within normal limits. Both lungs are clear. The visualized skeletal structures are unremarkable. IMPRESSION: No active cardiopulmonary disease. Electronically Signed   By: Ellery Plunk M.D.   On: 10/29/2016 23:10    Procedures Procedures (including critical care time)  Medications Ordered in ED Medications  acetaminophen (TYLENOL) tablet 650 mg (650 mg Oral Given 10/29/16 2145)     Initial Impression / Assessment and Plan / ED Course  I have reviewed the triage vital signs and the nursing notes.  Pertinent labs & imaging results that were  available during my care of the patient were reviewed by me and considered in my medical decision making (see chart for details).  Clinical Course    Patient presents with otalgia and exam consistent with acute otitis media. No concern for acute mastoiditis, meningitis. No antibiotic use in the last month.  Patient discharged home with Amoxicillin.  Other symptoms are likely viral. PCP follow up. Pt encouraged to take her HTN medications. I have also discussed reasons to return immediately to the ER.  Pt expresses understanding and agrees with plan.  Vitals:   10/29/16 2120 10/29/16 2121 10/30/16 0013  BP: (!) 170/101  127/60  Pulse: 94  118  Resp: 16  16  Temp: 101.6 F (38.7 C)  99.5 F (37.5 C)  TempSrc: Oral  Oral  SpO2: 94%  94%  Weight:  113.4 kg   Height:  5\' 4"  (1.626 m)      Final Clinical Impressions(s) / ED Diagnoses   Final diagnoses:  Upper respiratory tract infection, unspecified type  Acute otitis media, unspecified otitis media type     New Prescriptions Discharge Medication List as of 10/29/2016 11:48 PM    START taking these medications   Details  amoxicillin (AMOXIL) 500 MG capsule Take 1 capsule (500 mg total) by mouth 2 (two) times daily., Starting Mon 10/29/2016, Until Thu 11/08/2016, Print    benzonatate (TESSALON) 100 MG capsule Take 1 capsule (100 mg total) by mouth every 8 (eight) hours., Starting Mon 10/29/2016, Print    ondansetron (ZOFRAN ODT) 4 MG disintegrating tablet Take 1 tablet (4 mg total) by mouth every 8 (eight) hours as needed for nausea or vomiting., Starting Mon 10/29/2016, Print       I personally performed the services described in this documentation, which was scribed in my presence. The recorded information has been reviewed and is accurate.    Anselm Pancoast, PA-C 10/30/16 0041    Azalia Bilis, MD 10/30/16 [phone]

## 2016-10-29 NOTE — Discharge Instructions (Signed)
Most of your symptoms are likely caused by a virus, however, you also have evidence of an ear infection. Please take all of your antibiotics until finished!   You may develop abdominal discomfort or diarrhea from the antibiotic.  You may help offset this with probiotics which you can buy or get in yogurt. Do not eat or take the probiotics until 2 hours after your antibiotic.  Treatment also includes symptomatic care and it is important to note that these symptoms may last for 7-10 days. Drink plenty of fluids and get plenty of rest. You should be drinking at least a one half to one liter of water an hour to stay hydrated. Ibuprofen, Naproxen, or Tylenol for pain or fever. Zofran for nausea. Tessalon for cough. Plain Mucinex may help relieve congestion. Warm liquids or Chloraseptic spray may help soothe a sore throat.

## 2017-02-18 ENCOUNTER — Other Ambulatory Visit: Payer: Self-pay | Admitting: *Deleted

## 2017-02-18 MED ORDER — ALBUTEROL SULFATE HFA 108 (90 BASE) MCG/ACT IN AERS: 2.0000 | INHALATION_SPRAY | Freq: Four times a day (QID) | RESPIRATORY_TRACT | 6 refills | Status: DC | PRN

## 2017-06-19 DIAGNOSIS — J019 Acute sinusitis, unspecified: Secondary | ICD-10-CM | POA: Diagnosis not present

## 2017-09-02 IMAGING — CR DG CHEST 2V
2 series · 2 of 2 positions shown · non-contrast
Comparison: 05/07/2015

CLINICAL DATA: Midsternal chest pain, onset this morning. Febrile
today, with productive cough.

EXAM:
CHEST  2 VIEW

[chest pa]
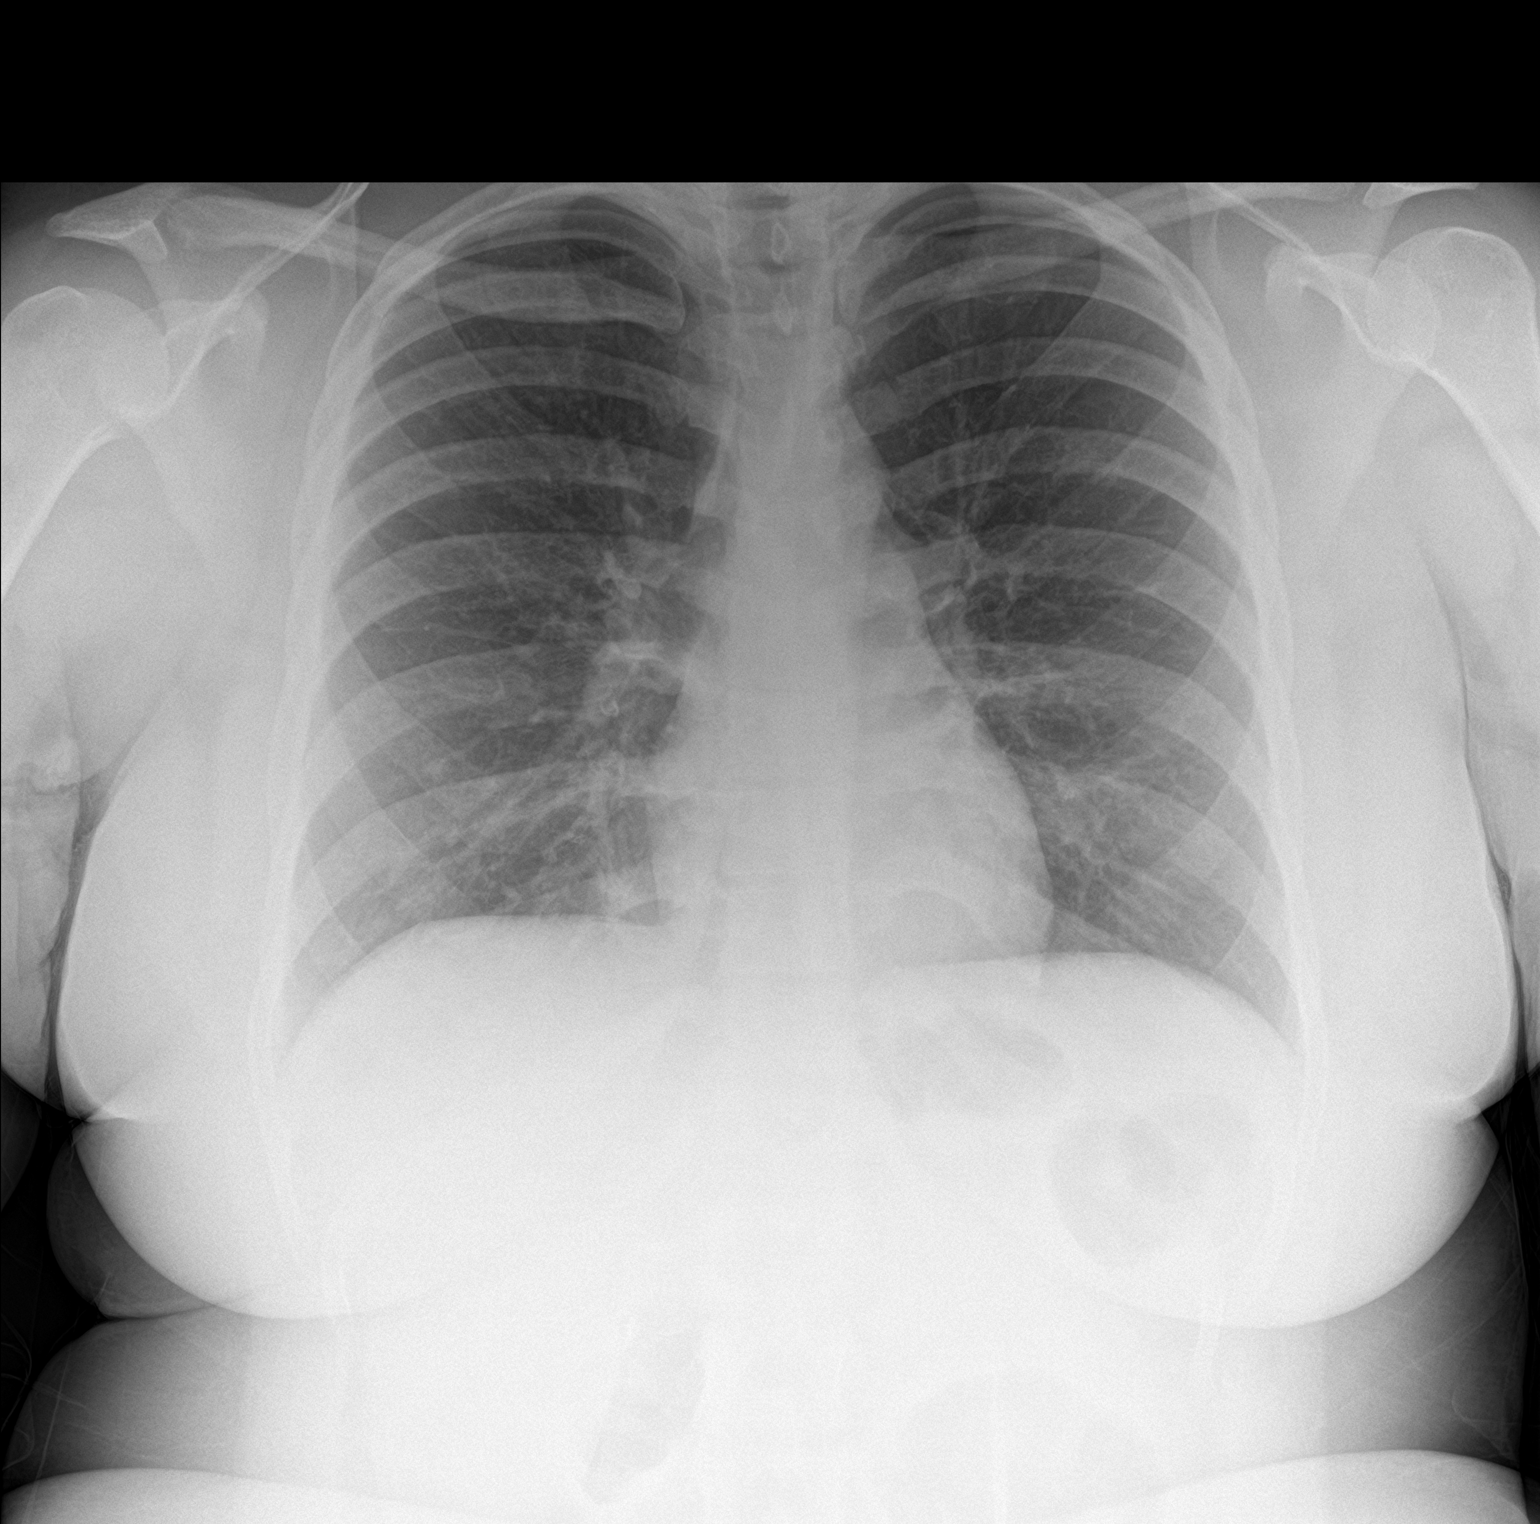

[chest lat]
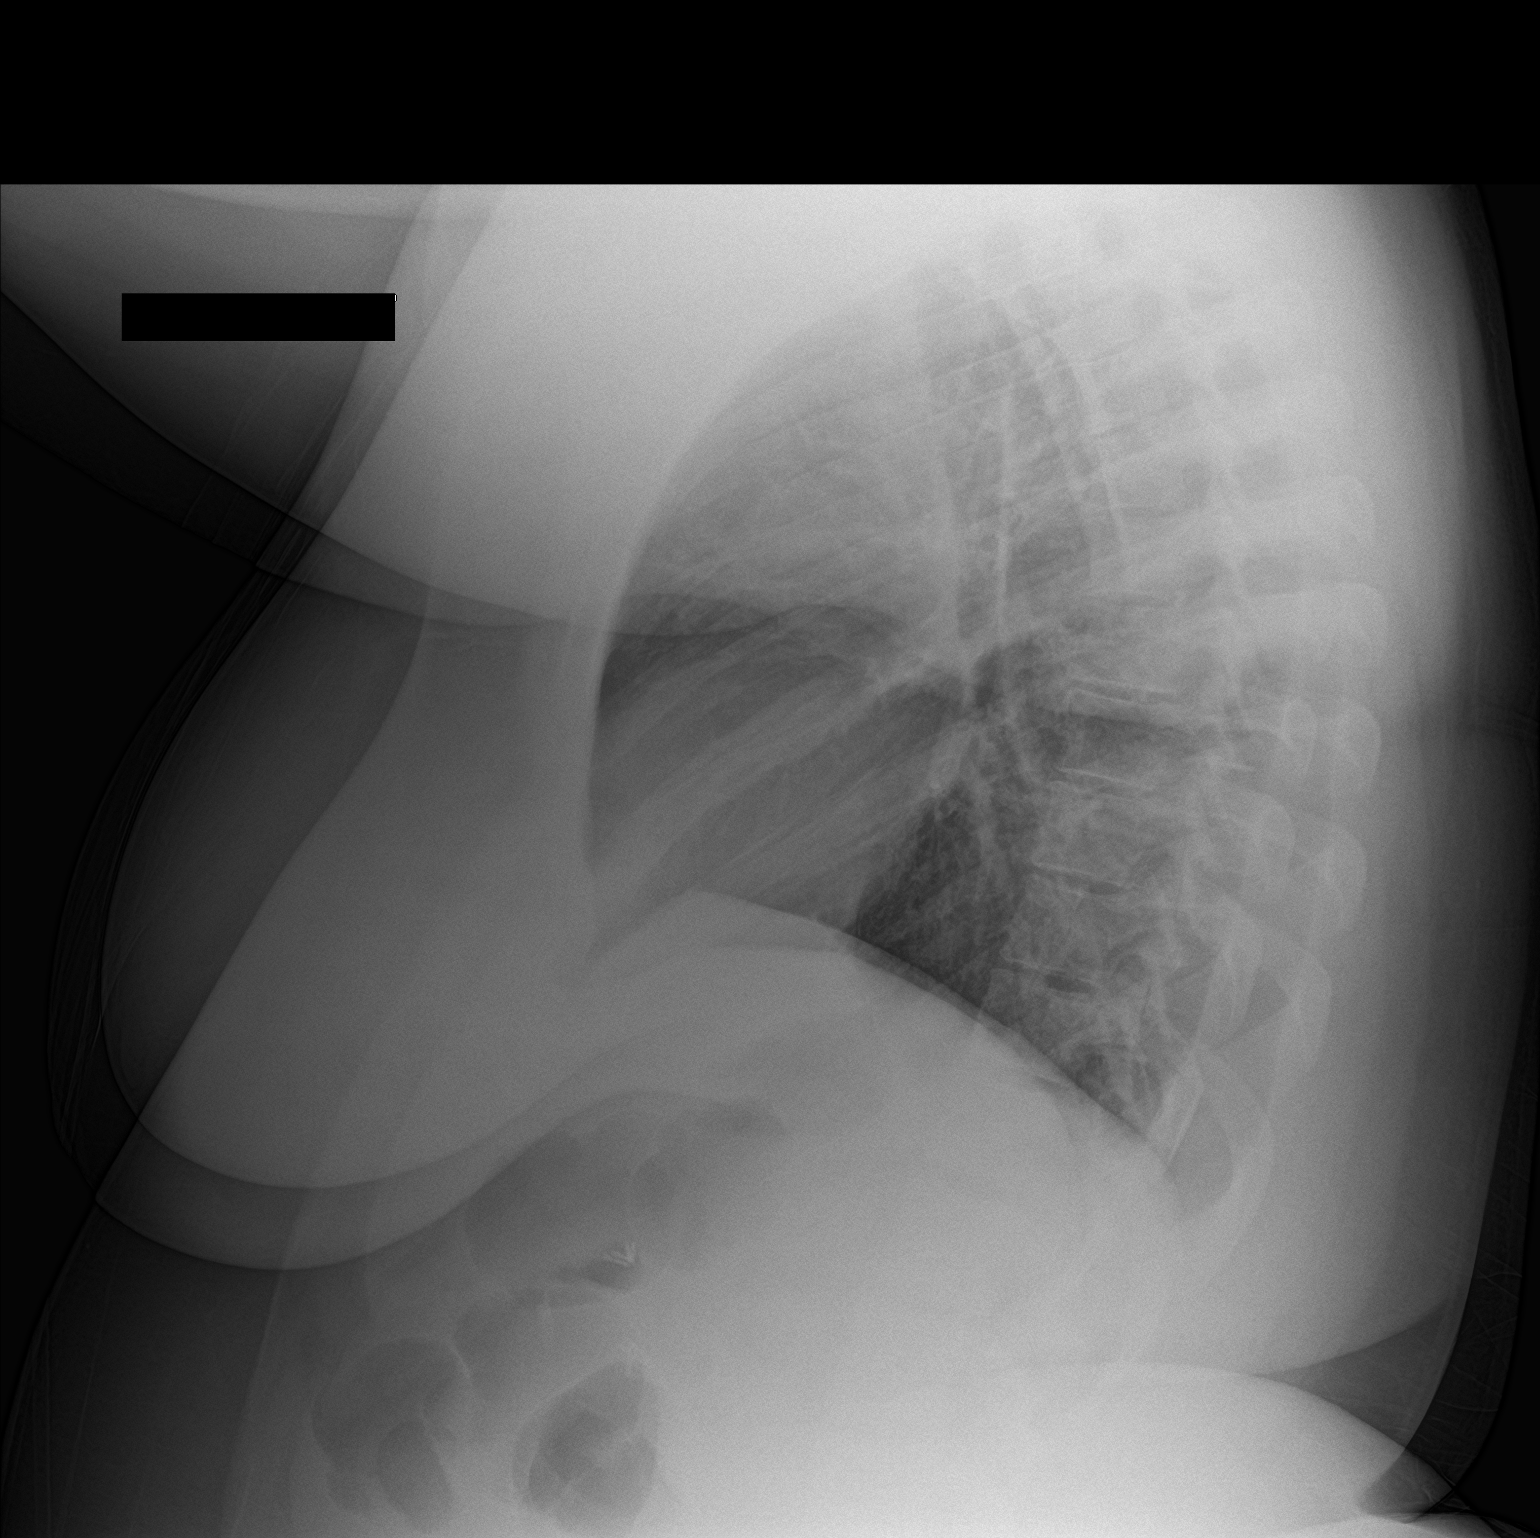

[2 of 2 positions shown; findings below may reference images not displayed]

FINDINGS: The heart size and mediastinal contours are within normal limits.
Both lungs are clear. The visualized skeletal structures are
unremarkable.
IMPRESSION: No active cardiopulmonary disease.

## 2017-09-10 ENCOUNTER — Telehealth: Payer: Self-pay | Admitting: Internal Medicine

## 2017-09-10 NOTE — Telephone Encounter (Signed)
Refilled albuterol 

## 2017-09-26 ENCOUNTER — Other Ambulatory Visit: Payer: Self-pay | Admitting: *Deleted

## 2017-09-26 MED ORDER — ALBUTEROL SULFATE HFA 108 (90 BASE) MCG/ACT IN AERS
2.0000 | INHALATION_SPRAY | Freq: Four times a day (QID) | RESPIRATORY_TRACT | 0 refills | Status: DC | PRN
Start: 2017-09-26 — End: 2017-10-25

## 2017-10-25 ENCOUNTER — Other Ambulatory Visit: Payer: Self-pay | Admitting: Internal Medicine

## 2017-10-25 MED ORDER — ALBUTEROL SULFATE HFA 108 (90 BASE) MCG/ACT IN AERS: 2.0000 | INHALATION_SPRAY | Freq: Four times a day (QID) | RESPIRATORY_TRACT | 0 refills | Status: DC | PRN

## 2017-10-25 NOTE — Telephone Encounter (Signed)
Patient needs to come in for follow up. Will give 1 refill on Albuterol inhaler. Thanks Bryce Kimble

## 2017-12-10 ENCOUNTER — Encounter: Payer: Self-pay | Admitting: Internal Medicine

## 2017-12-10 ENCOUNTER — Other Ambulatory Visit: Payer: Self-pay

## 2017-12-10 ENCOUNTER — Ambulatory Visit (INDEPENDENT_AMBULATORY_CARE_PROVIDER_SITE_OTHER): Payer: 59 | Admitting: Internal Medicine

## 2017-12-10 VITALS — BP 148/88 | HR 98 | Temp 98.8°F | Ht 64.0 in | Wt 254.8 lb

## 2017-12-10 DIAGNOSIS — I10 Essential (primary) hypertension: Secondary | ICD-10-CM

## 2017-12-10 DIAGNOSIS — J4520 Mild intermittent asthma, uncomplicated: Secondary | ICD-10-CM

## 2017-12-10 DIAGNOSIS — Z309 Encounter for contraceptive management, unspecified: Secondary | ICD-10-CM

## 2017-12-10 MED ORDER — HYDROCHLOROTHIAZIDE 12.5 MG PO TABS
25.0000 mg | ORAL_TABLET | Freq: Every day | ORAL | 3 refills | Status: DC
Start: 2017-12-10 — End: 2022-03-16

## 2017-12-10 MED ORDER — LISINOPRIL 10 MG PO TABS: 10.0000 mg | ORAL_TABLET | Freq: Every day | ORAL | 3 refills | Status: DC

## 2017-12-10 MED ORDER — ALBUTEROL SULFATE HFA 108 (90 BASE) MCG/ACT IN AERS: 2.0000 | INHALATION_SPRAY | Freq: Four times a day (QID) | RESPIRATORY_TRACT | 6 refills | Status: DC | PRN

## 2017-12-10 NOTE — Assessment & Plan Note (Signed)
Blood pressure uncontrolled today -Patient is not currently on her lisinopril-we will restart this medication -Continue hydrochlorothiazide 25 mg -Patient is going to check her blood pressures 2-3 times a week for the next month if blood pressure is elevated she will return for follow-up appointment, otherwise patient would like to follow-up in a year

## 2017-12-10 NOTE — Assessment & Plan Note (Signed)
Well-controlled - patient states she has been off of Flovent for the last 2 years without any exacerbations or symptoms -Will refill her albuterol she uses this for exercise-induced asthma

## 2017-12-10 NOTE — Assessment & Plan Note (Signed)
Nexplanon in place  episode of heavy bleeding x1 Discussed if patient continues to have these episodes to follow-up with me

## 2017-12-10 NOTE — Progress Notes (Signed)
   [facility] [name], [name] Phone: [phone]  Reason For Visit: Follow up   #Contraception  -Nexplanon 2016, still in place  -Patient states about 1 month ago she had period with heavy bleeding lasting 7 days. She has never had this with her Nexplanon before.   #CHRONIC HTN: Current Meds - Currently on HCTZ, has not been taking lisinopril  Reports good compliance, took meds today. Tolerating well, w/o complaints. Lifestyle - has been seeing a Systems analystpersonal trainer and would like to see nutrition  Denies CP, dyspnea, HA, edema, dizziness / lightheadedness  #Asthma  No issues with nightime or early morning   Only use albuterol inhaler during exercise Patient stopped the Flovent about 2 years  Denies any symptoms of cough, shortness of breath, or chest tightness:  Emergency department or Marks for asthma exacerbation: No   Past Medical History Reviewed problem list.  Medications- reviewed and updated No additions to family history Social history- patient is a non- smoker  Objective: BP (!) 148/88   Pulse 98   Temp 98.8 F (37.1 C) (Oral)   Ht 5\' 4"  (1.626 m)   Wt 254 lb 12.8 oz (115.6 kg)   SpO2 99%   BMI 43.74 kg/m  Gen: NAD, alert, cooperative with exam Cardio: regular rate and rhythm, S1S2 heard, no murmurs appreciated Pulm: clear to auscultation bilaterally, no wheezes, rhonchi or rales GI: soft, non-tender, non-distended, bowel sounds present, no hepatomegaly, no splenomegaly  MSK: Normal gait and station Skin: dry, intact, no rashes or lesions Neuro: Strength and sensation grossly intact   Assessment/Plan: See problem based a/p  HTN (hypertension) Blood pressure uncontrolled today -Patient is not currently on her lisinopril-we will restart this medication -Continue hydrochlorothiazide 25 mg -Patient is going to check her blood pressures 2-3 times a week for the next month if blood pressure is elevated she will return for follow-up  appointment, otherwise patient would like to follow-up in a year  Birth control Nexplanon in place  episode of heavy bleeding x1 Discussed if patient continues to have these episodes to follow-up with me  Severe obesity (BMI >= 40) Patient would like to see [name], provided her with nutrition card  She is also working with a trainer currently very excited to lose weight  Intermittent asthma, well controlled Well-controlled - patient states she has been off of Flovent for the last 2 years without any exacerbations or symptoms -Will refill her albuterol she uses this for exercise-induced asthma

## 2017-12-10 NOTE — Patient Instructions (Signed)
It was nice seeing you today. Your goal blood pressure is 120/80. Please check your blood pressures over the next couple of weeks to make sure that they are at goal. If you have issues with keep your blood pressure in this range. Please follow up in 1 month, otherwise follow up in 1 year

## 2017-12-10 NOTE — Assessment & Plan Note (Signed)
Patient would like to see Dr. Gerilyn PilgrimSykes, provided her with nutrition card  She is also working with a trainer currently very excited to lose weight

## 2017-12-11 LAB — BASIC METABOLIC PANEL
BUN/Creatinine Ratio: 14 (ref 9–23)
BUN: 14 mg/dL (ref 6–20)
CALCIUM: 9.7 mg/dL (ref 8.7–10.2)
CHLORIDE: 100 mmol/L (ref 96–106)
CO2: 26 mmol/L (ref 20–29)
Creatinine, Ser: 0.98 mg/dL (ref 0.57–1.00)
GFR calc non Af Amer: 79 mL/min/{1.73_m2} (ref >59)
GFR, EST AFRICAN AMERICAN: 91 mL/min/{1.73_m2} (ref >59)
GLUCOSE: 84 mg/dL (ref 65–99)
POTASSIUM: 4.2 mmol/L (ref 3.5–5.2)
Sodium: 140 mmol/L (ref 134–144)

## 2017-12-16 ENCOUNTER — Encounter: Payer: Self-pay | Admitting: Internal Medicine

## 2018-01-20 ENCOUNTER — Ambulatory Visit: Payer: No Typology Code available for payment source | Admitting: Family Medicine

## 2018-01-23 ENCOUNTER — Other Ambulatory Visit: Payer: Self-pay | Admitting: Internal Medicine

## 2018-01-23 ENCOUNTER — Encounter: Payer: Self-pay | Admitting: Internal Medicine

## 2018-05-11 ENCOUNTER — Emergency Department (HOSPITAL_COMMUNITY)
Admission: EM | Admit: 2018-05-11 | Discharge: 2018-05-11 | Disposition: A | Discharge status: Home / Self Care | Payer: No Typology Code available for payment source | Attending: Emergency Medicine | Admitting: Emergency Medicine

## 2018-05-11 ENCOUNTER — Other Ambulatory Visit: Payer: Self-pay

## 2018-05-11 ENCOUNTER — Encounter (HOSPITAL_COMMUNITY): Payer: Self-pay

## 2018-05-11 ENCOUNTER — Emergency Department (HOSPITAL_COMMUNITY): Payer: No Typology Code available for payment source

## 2018-05-11 DIAGNOSIS — S61309A Unspecified open wound of unspecified finger with damage to nail, initial encounter: Secondary | ICD-10-CM

## 2018-05-11 DIAGNOSIS — I10 Essential (primary) hypertension: Secondary | ICD-10-CM | POA: Insufficient documentation

## 2018-05-11 DIAGNOSIS — J45909 Unspecified asthma, uncomplicated: Secondary | ICD-10-CM | POA: Insufficient documentation

## 2018-05-11 DIAGNOSIS — W260XXA Contact with knife, initial encounter: Secondary | ICD-10-CM | POA: Insufficient documentation

## 2018-05-11 DIAGNOSIS — Y93G1 Activity, food preparation and clean up: Secondary | ICD-10-CM | POA: Insufficient documentation

## 2018-05-11 DIAGNOSIS — Y990 Civilian activity done for income or pay: Secondary | ICD-10-CM | POA: Insufficient documentation

## 2018-05-11 DIAGNOSIS — S61303A Unspecified open wound of left middle finger with damage to nail, initial encounter: Secondary | ICD-10-CM | POA: Insufficient documentation

## 2018-05-11 DIAGNOSIS — Z79899 Other long term (current) drug therapy: Secondary | ICD-10-CM | POA: Insufficient documentation

## 2018-05-11 DIAGNOSIS — Y9289 Other specified places as the place of occurrence of the external cause: Secondary | ICD-10-CM | POA: Insufficient documentation

## 2018-05-11 HISTORY — DX: Essential (primary) hypertension: I10

## 2018-05-11 MED ORDER — HYDROCODONE-ACETAMINOPHEN 5-325 MG PO TABS
2.0000 | ORAL_TABLET | Freq: Once | ORAL | Status: AC
  Administered 2018-05-11: 2 via ORAL
  Filled 2018-05-11: qty 2

## 2018-05-11 NOTE — ED Provider Notes (Signed)
[facility] EMERGENCY DEPARTMENT Provider Note   CSN: [id number] Arrival date & time: 05/11/18  1521     History   Chief Complaint Chief Complaint  Patient presents with  . Extremity Laceration    HPI [name] is a 27 y.o. female who presents for evaluation of left middle finger injury that occurred today.  Patient reports she was at work and was using a knife to top of some vegetables when the knife slipped and caused an injury to the distal aspect of her left middle finger.  Patient reports that her tetanus was in the last 6 years.  Patient states that she is not taking anything for the pain.  Patient denies any numbness/weakness.  The history is provided by the patient.    Past Medical History:  Diagnosis Date  . Asthma   . Hypertension   . Obesity     Patient Active Problem List   Diagnosis Date Noted  . Birth control 09/26/2015  . HTN (hypertension) 09/16/2015  . Severe obesity (BMI >= 40) (HCC) 09/16/2015  . ECZEMA 06/07/2008  . MIGRAINE, UNSPEC., W/O INTRACTABLE MIGRAINE 02/27/2007  . RHINITIS, ALLERGIC 02/27/2007  . Intermittent asthma, well controlled 02/27/2007    Past Surgical History:  Procedure Laterality Date  . CHOLECYSTECTOMY    . TONSILLECTOMY       OB History    Gravida  1   Para  1   Term      Preterm  1   AB      Living  1     SAB      TAB      Ectopic      Multiple      Live Births  1            Home Medications    Prior to Admission medications   Medication Sig Start Date End Date Taking? Authorizing Provider  albuterol (PROAIR HFA) 108 (90 Base) MCG/ACT inhaler Inhale 2 puffs into the lungs every 6 (six) hours as needed for wheezing or shortness of breath. 12/10/17   Mikell, Antionette Poles, MD  albuterol (PROVENTIL HFA;VENTOLIN HFA) 108 (90 Base) MCG/ACT inhaler Inhale 2 puffs into the lungs every 6 (six) hours as needed for wheezing or shortness of breath. 10/25/17   Mikell, Antionette Poles,  MD  cetirizine (ZYRTEC) 10 MG tablet Take 1 tablet (10 mg total) by mouth at bedtime. 10/25/16   Palma Holter, MD  hydrochlorothiazide (HYDRODIURIL) 12.5 MG tablet Take 2 tablets (25 mg total) by mouth daily. 12/10/17   Mikell, Antionette Poles, MD  lisinopril (PRINIVIL,ZESTRIL) 10 MG tablet Take 1 tablet (10 mg total) by mouth daily. 12/10/17   Mikell, Antionette Poles, MD  ondansetron (ZOFRAN ODT) 4 MG disintegrating tablet Take 1 tablet (4 mg total) by mouth every 8 (eight) hours as needed for nausea or vomiting. 10/29/16   Joy, Shawn C, PA-C  phenol (CHLORASEPTIC) 1.4 % LIQD Use as directed 1 spray in the mouth or throat every 6 (six) hours as needed for throat irritation / pain. 10/25/16   Palma Holter, MD    Family History Family History  Problem Relation Age of Onset  . Anesthesia problems Neg Hx   . Hypotension Neg Hx   . Malignant hyperthermia Neg Hx   . Pseudochol deficiency Neg Hx     Social History Social History   Tobacco Use  . Smoking status: Never Smoker  . Smokeless tobacco: Never Used  Substance  Use Topics  . Alcohol use: No  . Drug use: No     Allergies   Patient has no known allergies.   Review of Systems Review of Systems  Skin: Positive for wound.  Neurological: Negative for weakness and numbness.     Physical Exam Updated Vital Signs BP 135/88 (BP Location: Right Arm)   Pulse 71   Temp 98 F (36.7 C) (Oral)   Resp 16   Ht 5' 3.5" (1.613 m)   Wt 111.1 kg (245 lb)   SpO2 100%   BMI 42.72 kg/m   Physical Exam  Constitutional: She appears well-developed and well-nourished.  HENT:  Head: Normocephalic and atraumatic.  Eyes: Conjunctivae and EOM are normal. Right eye exhibits no discharge. Left eye exhibits no discharge. No scleral icterus.  Cardiovascular:  Pulses:      Radial pulses are 2+ on the right side, and 2+ on the left side.  Pulmonary/Chest: Effort normal.  Musculoskeletal:  Tenderness palpation noted distal aspect of  the left middle finger.  Flexion/extension of left PIP and DIP intact without any difficulty.  Full range of motion of all 5 digits of left hand.  No tenderness palpation of the left wrist.  Neurological: She is alert.  Skin: Skin is warm and dry. Capillary refill takes less than 2 seconds.  Patient with partial nail avulsion to the left middle finger.  There is some evidence of avulsion to the nailbed in the distal tip of the finger with no obvious laceration. Good distal cap refill.   Psychiatric: She has a normal mood and affect. Her speech is normal and behavior is normal.  Nursing note and vitals reviewed.         ED Treatments / Results  Labs (all labs ordered are listed, but only abnormal results are displayed) Labs Reviewed - No data to display  EKG None  Radiology Dg Hand Complete Left  Result Date: 05/11/2018 CLINICAL DATA:  The patient suffered a laceration of the left long finger today. Initial encounter. EXAM: LEFT HAND - COMPLETE 3+ VIEW COMPARISON:  None. FINDINGS: Bandaging is in place about the distal left long finger. No underlying fracture or body. Imaged bones appear normal. IMPRESSION: Laceration distal left long finger. Negative fracture or foreign body. Electronically Signed   By: Drusilla Kanner M.D.   On: 05/11/2018 16:04    Procedures Procedures (including critical care time)  Medications Ordered in ED Medications  HYDROcodone-acetaminophen (NORCO/VICODIN) 5-325 MG per tablet 2 tablet (2 tablets Oral Given 05/11/18 1637)     Initial Impression / Assessment and Plan / ED Course  I have reviewed the triage vital signs and the nursing notes.  Pertinent labs & imaging results that were available during my care of the patient were reviewed by me and considered in my medical decision making (see chart for details).     27 year old female who presents for evaluation of avulsion injury to the left middle finger.  Reports this happened using a knife to top  at work. Patient is afebrile, non-toxic appearing, sitting comfortably on examination table. Vital signs reviewed and stable.  Patient is neurovascularly intact.  Patient reports that her last tetanus was in the last 6 years.  Patient states she does not want updated today here in the department.  On exam, patient has a partial avulsion injury to the left nail with no obvious evidence of nailbed laceration.  The avulsion injury extends just to the distal tip of the finger.  No obvious  laceration.  Will obtain x-ray for evaluation of fracture.  Hand x-ray revealed no evidence of fracture or dislocation.  Discussed results with patient.  I discussed treatment options with patient.  At this time, given that there is no obvious laceration, no indication for suture repair here in the ED.  Offered to perform digital block and remove the nail but patient declined at this time.  She would rather that the nail grow back like it is.  Area covered in Dermabond to help with protection. Patient had ample opportunity for questions and discussion. All patient's questions were answered with full understanding. Strict return precautions discussed. Patient expresses understanding and agreement to plan.   Final Clinical Impressions(s) / ED Diagnoses   Final diagnoses:  Avulsion of fingernail, initial encounter    ED Discharge Orders    None       Maxwell Caul, PA-C 05/11/18 1645    Mancel Bale, MD 05/12/18 [phone]

## 2018-05-11 NOTE — ED Triage Notes (Signed)
Pt cut left the tip and part of nail on middle finger with a knife by accident at work this morning. PMS intact. Bleeding controlled. VSS

## 2018-05-11 NOTE — Discharge Instructions (Signed)
You can take Tylenol or Ibuprofen as directed for pain. You can alternate Tylenol and Ibuprofen every 4 hours. If you take Tylenol at 1pm, then you can take Ibuprofen at 5pm. Then you can take Tylenol again at 9pm.   As we discussed, the glue will fall off when it is ready.  Do not soak the finger in warm water or submerge in water.  [facility].  Return to the emergency department for any worsening pain, redness or swelling of the finger, drainage from the finger, fevers or any other worsening or concerning symptoms.

## 2018-11-06 ENCOUNTER — Ambulatory Visit: Payer: Self-pay | Admitting: Student in an Organized Health Care Education/Training Program

## 2018-11-06 DIAGNOSIS — J4520 Mild intermittent asthma, uncomplicated: Secondary | ICD-10-CM

## 2018-11-06 DIAGNOSIS — I10 Essential (primary) hypertension: Secondary | ICD-10-CM

## 2018-11-06 NOTE — Assessment & Plan Note (Signed)
BP 136/78 today. Refill patient's HCTZ Monitor in 1 month

## 2018-11-06 NOTE — Assessment & Plan Note (Signed)
Patient is currently uninsured so would accrue a large fee for exchanging nexplanon Offered her the choice of going to health department as alternative but she states that she would prefer to return here in about 1 month when her insurance from her new job begins Follow up in about 1 month

## 2018-11-06 NOTE — Assessment & Plan Note (Signed)
Well- controlled on current therapy Refill inhaler

## 2018-11-06 NOTE — Progress Notes (Signed)
   Subjective:    Patient ID: [name], female    DOB: 1991/02/01, 27 y.o.   MRN: [id number]   CC: nexplanon exchange  HPI: patient presents today for nexplanon exchange which I am happy to complete, however, patient has had a lapse in insurance and it would be a very expensive procedure on the patient's behalf. I offered her the choice of going to the health department or returning here when she get's insurance again and the patient chose to return here since she just started a new job and believes that her insurance will begin in about a month. I explained that her nexplanon should still provide contraceptive coverage in the meantime.  She also asked for a refill of her inhaler and BP medication. I will review her BP in the past and in this visit and refill those prescriptions. I talked to the patient about her overdue pap smear and flu vaccine which she elected to follow up on when she has insurance again.  Smoking status reviewed   ROS: pertinent noted in the HPI   Past Medical History:  Diagnosis Date  . Asthma   . Hypertension   . Obesity     Past Surgical History:  Procedure Laterality Date  . CHOLECYSTECTOMY    . TONSILLECTOMY      Past medical history, surgical, family, and social history reviewed and updated in the EMR as appropriate.  Objective:  There were no vitals taken for this visit.  Vitals and nursing note reviewed  General: NAD, pleasant, able to participate in exam Cardiac: RRR, S1 S2 present. normal heart sounds, no murmurs. Respiratory: CTAB, normal effort, No wheezes, rales or rhonchi Extremities: no edema or cyanosis. Skin: warm and dry, no rashes noted Neuro: alert, no obvious focal deficits Psych: Normal affect and mood   Assessment & Plan:    HTN (hypertension) BP 136/78 today. Refill patient's HCTZ Monitor in 1 month  Intermittent asthma, well controlled Well- controlled on current therapy Refill inhaler  Birth  control Patient is currently uninsured so would accrue a large fee for exchanging nexplanon Offered her the choice of going to health department as alternative but she states that she would prefer to return here in about 1 month when her insurance from her new job begins Follow up in about 1 month   Jamelle Rushing, DO [facility] PGY-1

## 2018-11-06 NOTE — Patient Instructions (Signed)
It was a pleasure to see you today!  To summarize our discussion for this visit:  nexplanon insertion- not covered- can go to health department at [address]. In the meantime, your current birth control should still be effective up to 4 years  Some additional health maintenance measures we should update are: . Pap smear . Influenza vaccine   Please return to our clinic to see me in 1 year for nexplanon exchange if not done sooner.  Call the clinic at [phone] if your symptoms worsen or you have any concerns.  Thank you for allowing me to take part in your care,  Dr. Jamelle Rushing   Thanks for choosing [facility] for your primary care.

## 2018-12-14 ENCOUNTER — Encounter: Payer: Self-pay | Admitting: Student in an Organized Health Care Education/Training Program

## 2018-12-14 ENCOUNTER — Other Ambulatory Visit: Payer: Self-pay | Admitting: Internal Medicine

## 2018-12-15 ENCOUNTER — Other Ambulatory Visit: Payer: Self-pay | Admitting: Internal Medicine

## 2018-12-15 ENCOUNTER — Other Ambulatory Visit: Payer: Self-pay | Admitting: Student in an Organized Health Care Education/Training Program

## 2018-12-15 DIAGNOSIS — J4520 Mild intermittent asthma, uncomplicated: Secondary | ICD-10-CM

## 2018-12-15 NOTE — Telephone Encounter (Signed)
Pt needs to have her albuterol inhaler refilled.

## 2018-12-16 ENCOUNTER — Other Ambulatory Visit: Payer: Self-pay | Admitting: Student in an Organized Health Care Education/Training Program

## 2018-12-16 DIAGNOSIS — J4520 Mild intermittent asthma, uncomplicated: Secondary | ICD-10-CM

## 2018-12-16 MED ORDER — ALBUTEROL SULFATE HFA 108 (90 BASE) MCG/ACT IN AERS: 2.0000 | INHALATION_SPRAY | Freq: Four times a day (QID) | RESPIRATORY_TRACT | 0 refills | Status: DC | PRN

## 2018-12-16 NOTE — Telephone Encounter (Signed)
Pt's refill was sent to wrong pharmacy. Pt needs albuterol refill sent to [facility] on [address]. Please advise

## 2018-12-16 NOTE — Addendum Note (Signed)
Addended by: Jone BasemanFLEEGER, JESSICA D on: 12/16/2018 03:09 PM   Modules accepted: Orders

## 2018-12-16 NOTE — Telephone Encounter (Signed)
Received fax request from pharmacy.   Original was set to print. Resent as requested.  Fleeger, Maryjo RochesterJessica Dawn, CMA

## 2019-02-18 ENCOUNTER — Other Ambulatory Visit (HOSPITAL_COMMUNITY)
Admission: RE | Admit: 2019-02-18 | Discharge: 2019-02-18 | Disposition: A | Discharge status: Home / Self Care | Payer: 59 | Source: Ambulatory Visit | Attending: Family Medicine | Admitting: Family Medicine

## 2019-02-18 ENCOUNTER — Other Ambulatory Visit: Payer: Self-pay

## 2019-02-18 ENCOUNTER — Other Ambulatory Visit: Payer: Self-pay | Admitting: *Deleted

## 2019-02-18 ENCOUNTER — Ambulatory Visit (INDEPENDENT_AMBULATORY_CARE_PROVIDER_SITE_OTHER): Payer: 59 | Admitting: Student in an Organized Health Care Education/Training Program

## 2019-02-18 ENCOUNTER — Encounter: Payer: Self-pay | Admitting: Student in an Organized Health Care Education/Training Program

## 2019-02-18 VITALS — BP 128/68 | Temp 98.7°F | Ht 64.0 in | Wt 258.0 lb

## 2019-02-18 DIAGNOSIS — Z124 Encounter for screening for malignant neoplasm of cervix: Secondary | ICD-10-CM | POA: Insufficient documentation

## 2019-02-18 DIAGNOSIS — N898 Other specified noninflammatory disorders of vagina: Secondary | ICD-10-CM

## 2019-02-18 DIAGNOSIS — J4520 Mild intermittent asthma, uncomplicated: Secondary | ICD-10-CM | POA: Diagnosis not present

## 2019-02-18 DIAGNOSIS — Z01419 Encounter for gynecological examination (general) (routine) without abnormal findings: Secondary | ICD-10-CM | POA: Diagnosis not present

## 2019-02-18 LAB — POCT WET PREP (WET MOUNT)
Clue Cells Wet Prep Whiff POC: NEGATIVE
Trichomonas Wet Prep HPF POC: ABSENT

## 2019-02-18 MED ORDER — ALBUTEROL SULFATE HFA 108 (90 BASE) MCG/ACT IN AERS: 2.0000 | INHALATION_SPRAY | Freq: Four times a day (QID) | RESPIRATORY_TRACT | 0 refills | Status: DC | PRN

## 2019-02-18 NOTE — Patient Instructions (Signed)
It was a pleasure to see you today!  To summarize our discussion for this visit:  Regular womens wellness exam today  wil call you with results  Replace nexplanon at next visit and discuss your period if still happening then  Increasing vegetables in diet  Increasing weight training and at beginning of workouts.  Refill inhaler   Please return to our clinic to see me in about 1 month for nexplanon.  Call the clinic at [phone] if your symptoms worsen or you have any concerns.  Thank you for allowing me to take part in your care,  Dr. Jamelle Rushing   Thanks for choosing [facility] for your primary care.

## 2019-02-18 NOTE — Assessment & Plan Note (Signed)
Normal exam Pap smear and wet prep collected Wet prep normal Will notify patient when pap results Recommended patient make appointment for nexplanon replacement march 17th but I was not able to do so myself because there was a technology malfunction at the time of appointment.

## 2019-02-18 NOTE — Progress Notes (Signed)
   Subjective:    Patient ID: [name], female    DOB: 09/06/1991, 28 y.o.   MRN: [id number]   CC: routine women's wellness exam  HPI: She has no specific concerns today and states that she is overall doing well and busy with work/school.  Women's health- Last pap smear performed 07/2014 was normal. Did not see HPV testing in chart. Patient is on period currently and has been bleeding intermittently for between 2-3 weeks.  She describes the bleeding as heavier than her normal periods and she has been having more cramping than usual. She has concern that she may have a yeast infection and would like testing for that as well.  She states that she has intermittent white discharge and intermittent vaginal itching but not consistently. Denies dysuria. Denies concern for STI or pregnancy. Has one female partner and is monogamous.  She feels safe in her relationships. She would like to take her nexplanon out and replace with a new one.  She has had it for about 3 years.   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 She would like to work with kids. She now exercises about 30 minute on a treadmill one day per week with occasional weight lifting. She uses her albuterol inhaler prophylacticly prior to exercise and/or during about 2x/week.  Diet- she has a self-described horrible diet. She eats a lot of sweets, especially when on her period. She has vegetables about once every other day  Smoking status reviewed   ROS: pertinent noted in the HPI   Past Medical History:  Diagnosis Date  . Asthma   . Hypertension   . Obesity    Past medical history, surgical, family, and social history reviewed and updated in the EMR as appropriate.  Objective:  BP 128/68   Temp 98.7 F (37.1 C) (Oral)   Ht 5\' 4"  (1.626 m)   Wt 258 lb (117 kg)   LMP 02/02/2019 (Exact Date)   BMI 44.29 kg/m   Vitals and  nursing note reviewed  General: NAD, pleasant, able to participate in exam Cardiac: RRR, S1 S2 present. normal heart sounds, no murmurs. Respiratory: CTAB, normal effort, No wheezes, rales or rhonchi Pelvic: normal external genitalia, no excessive discharge or odor. Cervix- normal in appearance, anterior position, non-tender on exam, mild bleeding from os. Breast: symmetrical in size and shape, no nipple discharge, non-inverted, no skin dimpling or lesions, no masses palpated in breast or axilla.  Extremities: no edema or cyanosis. Skin: warm and dry, no rashes noted Neuro: alert, no obvious focal deficits Psych: Normal affect and mood   Assessment & Plan:    Intermittent asthma, well controlled Patient uses rescue inhaler about 2x/week prior to exercise Refill albuterol today  Well woman exam with routine gynecological exam Normal exam Pap smear and wet prep collected Wet prep normal Will notify patient when pap results Recommended patient make appointment for nexplanon replacement march 17th but I was not able to do so myself because there was a technology malfunction at the time of appointment.    Jamelle Rushing, DO [facility] PGY-1

## 2019-02-18 NOTE — Assessment & Plan Note (Signed)
Patient uses rescue inhaler about 2x/week prior to exercise Refill albuterol today

## 2019-02-20 LAB — CYTOLOGY - PAP
Diagnosis: UNDETERMINED — AB
HPV (WINDOPATH): NOT DETECTED

## 2019-02-23 ENCOUNTER — Other Ambulatory Visit: Payer: Self-pay | Admitting: Student in an Organized Health Care Education/Training Program

## 2019-02-23 NOTE — Progress Notes (Signed)
Pap smear 02/18/2019 returned with ASCUS, HPV high-risk negative. Notified patient of results. Discussed the options for observation and patient elected to have cytology re-examination in 1 year. Updated modifier to repeat in 1 year.

## 2019-02-26 ENCOUNTER — Other Ambulatory Visit: Payer: Self-pay

## 2019-02-26 ENCOUNTER — Ambulatory Visit (INDEPENDENT_AMBULATORY_CARE_PROVIDER_SITE_OTHER): Payer: 59 | Admitting: Family Medicine

## 2019-02-26 VITALS — BP 145/81 | HR 84 | Temp 98.1°F | Wt 256.0 lb

## 2019-02-26 DIAGNOSIS — Z3046 Encounter for surveillance of implantable subdermal contraceptive: Secondary | ICD-10-CM | POA: Diagnosis not present

## 2019-02-26 DIAGNOSIS — Z308 Encounter for other contraceptive management: Secondary | ICD-10-CM | POA: Diagnosis not present

## 2019-02-26 LAB — POCT URINE PREGNANCY: Preg Test, Ur: NEGATIVE

## 2019-02-26 MED ORDER — ETONOGESTREL 68 MG ~~LOC~~ IMPL
68.0000 mg | DRUG_IMPLANT | Freq: Once | SUBCUTANEOUS | Status: AC
  Administered 2019-02-26: 68 mg via SUBCUTANEOUS

## 2019-02-27 NOTE — Progress Notes (Signed)
Patient is overdue Nexplanon removal.  She would also like reinsertion.  PROCEDURE NOTE: NEXPLANON  REMOVAL and REINSERTION  REMOVAA Patient given informed consent and signed copy in the chart for both procedures. an appropriate time out was been taken Pregnancy test negative Right arm area prepped and draped in the usual sterile fashion. Three cc of 1% lidocaine without epinephrine used for local anesthesia. A small stab incision was made close to the nexplanon with scalpel. Hemostats were used to withdraw the nexplanon  INSERTION new NEXPLANON  Prior to the removal procedure, an appropriate time out had been taken and  there were no breaks in patient care between the procedures. Sterile field had been maintained as well as local anesthesia.   Nexplanon was inserted in typical fashion in the right ARM (same site previously used) and where removal was performed. There were no complications.  Pressure bandage was  applied to decrease bruising. Patient given follow up instructions should she experience redness, swelling at sight or fever in the next 24 hours. Patient given Nexplanon pocket card.

## 2019-05-22 ENCOUNTER — Other Ambulatory Visit: Payer: Self-pay | Admitting: Student in an Organized Health Care Education/Training Program

## 2019-05-22 DIAGNOSIS — J4520 Mild intermittent asthma, uncomplicated: Secondary | ICD-10-CM

## 2019-06-29 ENCOUNTER — Other Ambulatory Visit: Payer: Self-pay

## 2019-06-29 ENCOUNTER — Ambulatory Visit (INDEPENDENT_AMBULATORY_CARE_PROVIDER_SITE_OTHER): Payer: Self-pay | Admitting: Student in an Organized Health Care Education/Training Program

## 2019-06-29 VITALS — BP 127/75 | HR 74

## 2019-06-29 DIAGNOSIS — D500 Iron deficiency anemia secondary to blood loss (chronic): Secondary | ICD-10-CM

## 2019-06-29 NOTE — Patient Instructions (Addendum)
It was a pleasure to see you today!  To summarize our discussion for this visit:  We are checking your blood for anemia today.   We will follow up in about 2 weeks if you are still having bleeding to remove nexplanon and change to another birth control method.   Please return to our clinic to see me in 2 weeks for possible change in birth control.  Call the clinic at [phone] if your symptoms worsen or you have any concerns.  Thank you for allowing me to take part in your care,  Dr. Jamelle Rushinghelsey Malynn Lucy   Thanks for choosing [facility] for your primary care.  Levonorgestrel intrauterine device (IUD) What is this medicine? LEVONORGESTREL IUD (LEE voe nor jes trel) is a contraceptive (birth control) device. The device is placed inside the uterus by a healthcare professional. It is used to prevent pregnancy. This device can also be used to treat heavy bleeding that occurs during your period. This medicine may be used for other purposes; ask your health care provider or pharmacist if you have questions. COMMON BRAND NAME(S): Cameron AliKyleena, LILETTA, Mirena, Skyla What should I tell my health care provider before I take this medicine? They need to know if you have any of these conditions:  abnormal Pap smear  cancer of the breast, uterus, or cervix  diabetes  endometritis  genital or pelvic infection now or in the past  have more than one sexual partner or your partner has more than one partner  heart disease  history of an ectopic or tubal pregnancy  immune system problems  IUD in place  liver disease or tumor  problems with blood clots or take blood-thinners  seizures  use intravenous drugs  uterus of unusual shape  vaginal bleeding that has not been explained  an unusual or allergic reaction to levonorgestrel, other hormones, silicone, or polyethylene, medicines, foods, dyes, or preservatives  pregnant or trying to get pregnant  breast-feeding How should  I use this medicine? This device is placed inside the uterus by a health care professional. Talk to your pediatrician regarding the use of this medicine in children. Special care may be needed. Overdosage: If you think you have taken too much of this medicine contact a poison control center or emergency room at once. NOTE: This medicine is only for you. Do not share this medicine with others. What if I miss a dose? This does not apply. Depending on the brand of device you have inserted, the device will need to be replaced every 3 to 6 years if you wish to continue using this type of birth control. What may interact with this medicine? Do not take this medicine with any of the following medications:  amprenavir  bosentan  fosamprenavir This medicine may also interact with the following medications:  aprepitant  armodafinil  barbiturate medicines for inducing sleep or treating seizures  bexarotene  boceprevir  griseofulvin  medicines to treat seizures like carbamazepine, ethotoin, felbamate, oxcarbazepine, phenytoin, topiramate  modafinil  pioglitazone  rifabutin  rifampin  rifapentine  some medicines to treat HIV infection like atazanavir, efavirenz, indinavir, lopinavir, nelfinavir, tipranavir, ritonavir  St. John's wort  warfarin This list may not describe all possible interactions. Give your health care provider a list of all the medicines, herbs, non-prescription drugs, or dietary supplements you use. Also tell them if you smoke, drink alcohol, or use illegal drugs. Some items may interact with your medicine. What should I watch for while using this medicine? Visit  your doctor or health care professional for regular check ups. See your doctor if you or your partner has sexual contact with others, becomes HIV positive, or gets a sexual transmitted disease. This product does not protect you against HIV infection (AIDS) or other sexually transmitted diseases. You can  check the placement of the IUD yourself by reaching up to the top of your vagina with clean fingers to feel the threads. Do not pull on the threads. It is a good habit to check placement after each menstrual period. Call your doctor right away if you feel more of the IUD than just the threads or if you cannot feel the threads at all. The IUD may come out by itself. You may become pregnant if the device comes out. If you notice that the IUD has come out use a backup birth control method like condoms and call your health care provider. Using tampons will not change the position of the IUD and are okay to use during your period. This IUD can be safely scanned with magnetic resonance imaging (MRI) only under specific conditions. Before you have an MRI, tell your healthcare provider that you have an IUD in place, and which type of IUD you have in place. What side effects may I notice from receiving this medicine? Side effects that you should report to your doctor or health care professional as soon as possible:  allergic reactions like skin rash, itching or hives, swelling of the face, lips, or tongue  fever, flu-like symptoms  genital sores  high blood pressure  no menstrual period for 6 weeks during use  pain, swelling, warmth in the leg  pelvic pain or tenderness  severe or sudden headache  signs of pregnancy  stomach cramping  sudden shortness of breath  trouble with balance, talking, or walking  unusual vaginal bleeding, discharge  yellowing of the eyes or skin Side effects that usually do not require medical attention (report to your doctor or health care professional if they continue or are bothersome):  acne  breast pain  change in sex drive or performance  changes in weight  cramping, dizziness, or faintness while the device is being inserted  headache  irregular menstrual bleeding within first 3 to 6 months of use  nausea This list may not describe all possible  side effects. Call your doctor for medical advice about side effects. You may report side effects to FDA at 1-800-FDA-1088. Where should I keep my medicine? This does not apply. NOTE: This sheet is a summary. It may not cover all possible information. If you have questions about this medicine, talk to your doctor, pharmacist, or health care provider.  2020 Elsevier/Gold Standard (2018-10-28 13:22:01)

## 2019-06-29 NOTE — Progress Notes (Signed)
   Subjective:    Patient ID: [name], female    DOB: [birth year], 29 y.o.   MRN: [id number]   CC: vaginal bleeding  HPI: Patient comes in today to remove nexplanon. She has been having heavy and continuous vaginal bleeding since having new implant placed. The volume varies and is using up to 4 heavy flow tampons and multiple pads throughout a day. She has had nexplanon in the past but usually did not have a period at all while on it.  She endorses occasional headaches. Denies lightheadedness with standing, dizziness, chest pain, palpitations, SOB. She is interested in a change in birth control and would like to try an IUD.   Smoking status reviewed   ROS: pertinent noted in the HPI   Past medical history, surgical, family, and social history reviewed and updated in the EMR as appropriate.  Objective:  BP 127/75   Pulse 74   SpO2 99%   Vitals and nursing note reviewed  General: NAD, pleasant, able to participate in exam Eyes: pale conjunctival pallor  Cardiac: RRR, S1 S2 present. normal heart sounds, no murmurs. Respiratory: CTAB, normal effort, No wheezes, rales or rhonchi Extremities: no edema or cyanosis. nexplanon in place on left arm without edema or erythema. Skin: warm and dry, no rashes noted Neuro: alert, no obvious focal deficits Psych: Normal affect and mood   Assessment & Plan:    Birth control Symptoms consistent with anemia and reports heavy periods x2 months CBC to monitor Hgb and supplement as needed- Hgb returned 14.1 Discussed with patient to return in a few more weeks if she is still having bleeding and we can remove her nexplanon and replace with another bc option. Counseled patient on IUD insertion and she was agreeable to that option   Doristine Mango, [place] [facility] PGY-1

## 2019-06-30 LAB — CBC
Hematocrit: 39.8 % (ref 34.0–46.6)
Hemoglobin: 13 g/dL (ref 11.1–15.9)
MCH: 27.1 pg (ref 26.6–33.0)
MCHC: 32.7 g/dL (ref 31.5–35.7)
MCV: 83 fL (ref 79–97)
Platelets: 413 10*3/uL (ref 150–450)
RBC: 4.8 x10E6/uL (ref 3.77–5.28)
RDW: 14.5 % (ref 11.7–15.4)
WBC: 4.8 10*3/uL (ref 3.4–10.8)

## 2019-07-02 NOTE — Assessment & Plan Note (Signed)
Symptoms consistent with anemia and reports heavy periods x2 months CBC to monitor Hgb and supplement as needed- Hgb returned 14.1 Discussed with patient to return in a few more weeks if she is still having bleeding and we can remove her nexplanon and replace with another bc option. Counseled patient on IUD insertion and she was agreeable to that option

## 2019-07-27 ENCOUNTER — Telehealth: Payer: Self-pay | Admitting: Student in an Organized Health Care Education/Training Program

## 2019-07-27 DIAGNOSIS — J4520 Mild intermittent asthma, uncomplicated: Secondary | ICD-10-CM

## 2019-07-27 NOTE — Telephone Encounter (Signed)
[facility] [address]

## 2019-07-28 ENCOUNTER — Ambulatory Visit: Payer: Self-pay | Admitting: Student in an Organized Health Care Education/Training Program

## 2019-07-28 MED ORDER — ALBUTEROL SULFATE HFA 108 (90 BASE) MCG/ACT IN AERS: INHALATION_SPRAY | RESPIRATORY_TRACT | 1 refills | Status: DC

## 2019-09-23 ENCOUNTER — Other Ambulatory Visit: Payer: Self-pay | Admitting: Student in an Organized Health Care Education/Training Program

## 2019-09-23 DIAGNOSIS — J4520 Mild intermittent asthma, uncomplicated: Secondary | ICD-10-CM

## 2019-11-04 ENCOUNTER — Telehealth (INDEPENDENT_AMBULATORY_CARE_PROVIDER_SITE_OTHER): Payer: Self-pay | Admitting: Family Medicine

## 2019-11-04 DIAGNOSIS — Z20828 Contact with and (suspected) exposure to other viral communicable diseases: Secondary | ICD-10-CM

## 2019-11-04 DIAGNOSIS — Z20822 Contact with and (suspected) exposure to covid-19: Secondary | ICD-10-CM | POA: Insufficient documentation

## 2019-11-04 NOTE — Assessment & Plan Note (Signed)
Patient received email recently that 2 classmates tested + for COVID. She is now on day 10 of illness and is much improved, but still not back to 100%, still having wet cough and and lightheadedness, she has maintained her hydration and is finally able to keep foods down. - I recommended the patient have her husband and child tested. Her 29-year old daughter is in hybrid school, attending class in person a couple days each week, the rest is virtual. Corley can also have herself tested but I believe this will show up as negative as she is at day 10 of illness.  -She has done a GREAT job of treating her symptoms at home! -She was provided information for [employer] testing site for her to take the whole family -Recommended wearing a mask and maintaining social distancing if outside the home -She was instructed to contact us if results of COVID testing are unclear or for any additional guidance.

## 2019-11-04 NOTE — Progress Notes (Signed)
[name] Telemedicine Visit  Patient consented to have virtual visit. Method of visit: Video  Encounter participants: Patient: [name] - located at [facility] [phone], attempted call x 3 Provider: Daisy Marks - located at [facility] Others (if applicable): none  Chief Complaint: Follow up URI  HPI: Patient is a very pleasant individual who reports that since last Monday (Oct 26) she started having light headedness with standing, as well as a little diarrhea a couple days ago that then turned into being more constipated. Yesterday she had nausea in the morning but did not vomit. She has coughed very rarely but the cough is not consistent, per patient it's a wet moist cough. She's been taking DayQuil and NyQuil, has been drinking hot herbal teas, taking activated charcoal, Emergen-C and other OTC and natural remedies for her symptoms.   Today she feels better, she has been in bed resting the last several days, this is the first day she's had the energy to get out of bed. Her temp was 99.2*F at highest during the illness, had 1 day of sore throat, no body aches or chills. Her nose has been burning with inhalation, most likely from dryness or inflammation. She's been able to eat and drink and keep foods down today! She maintained her hydration during her illness. Now she occasionally has lightheadedness but her energy is better.   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 She lives with husband and daughter (33 years old). During her illness she has been isolating at home  ROS: per HPI  Pertinent PMHx: Eczema, Hydradenitis suppurativa, BMI = 43.9  Exam:  Respiratory: speaking in full sentences, comfortable work of breathing  Assessment/Plan: Close exposure to COVID-19 virus Patient received email recently that 2 classmates tested + for COVID. She is now on day 10 of illness and is much improved, but still not back to 100%, still  having wet cough and and lightheadedness, she has maintained her hydration and is finally able to keep foods down. - I recommended the patient have her husband and child tested. Her 8-year old daughter is in hybrid school, attending class in person a couple days each week, the rest is virtual. [name] can also have herself tested but I believe this will show up as negative as she is at day 10 of illness.  -She has done a GREAT job of treating her symptoms at home! -She was provided information for [employer] testing site for her to take the whole family -Recommended wearing a mask and maintaining social distancing if outside the home -She was instructed to contact us if results of COVID testing are unclear or for any additional guidance.  Time spent during visit with patient: 9:30 minutes  Milus Banister, [place], PGY-2 11/04/2019 2:05 PM

## 2020-01-02 ENCOUNTER — Other Ambulatory Visit: Payer: Self-pay | Admitting: Student in an Organized Health Care Education/Training Program

## 2020-01-02 DIAGNOSIS — J4520 Mild intermittent asthma, uncomplicated: Secondary | ICD-10-CM

## 2020-02-29 ENCOUNTER — Other Ambulatory Visit: Payer: Self-pay | Admitting: Student in an Organized Health Care Education/Training Program

## 2020-02-29 DIAGNOSIS — J4520 Mild intermittent asthma, uncomplicated: Secondary | ICD-10-CM

## 2020-04-25 ENCOUNTER — Other Ambulatory Visit: Payer: Self-pay | Admitting: Student in an Organized Health Care Education/Training Program

## 2020-04-25 DIAGNOSIS — J4520 Mild intermittent asthma, uncomplicated: Secondary | ICD-10-CM

## 2020-06-27 ENCOUNTER — Other Ambulatory Visit: Payer: Self-pay | Admitting: Student in an Organized Health Care Education/Training Program

## 2020-06-27 DIAGNOSIS — J4520 Mild intermittent asthma, uncomplicated: Secondary | ICD-10-CM

## 2021-03-13 ENCOUNTER — Other Ambulatory Visit: Payer: Self-pay

## 2021-03-13 ENCOUNTER — Ambulatory Visit (INDEPENDENT_AMBULATORY_CARE_PROVIDER_SITE_OTHER): Payer: 59 | Admitting: Student in an Organized Health Care Education/Training Program

## 2021-03-13 ENCOUNTER — Encounter: Payer: Self-pay | Admitting: Student in an Organized Health Care Education/Training Program

## 2021-03-13 VITALS — BP 150/90 | HR 93 | Ht 64.0 in | Wt 234.1 lb

## 2021-03-13 DIAGNOSIS — Z23 Encounter for immunization: Secondary | ICD-10-CM

## 2021-03-13 DIAGNOSIS — J45990 Exercise induced bronchospasm: Secondary | ICD-10-CM

## 2021-03-13 DIAGNOSIS — I10 Essential (primary) hypertension: Secondary | ICD-10-CM

## 2021-03-13 DIAGNOSIS — J4520 Mild intermittent asthma, uncomplicated: Secondary | ICD-10-CM | POA: Diagnosis not present

## 2021-03-13 MED ORDER — ALBUTEROL SULFATE HFA 108 (90 BASE) MCG/ACT IN AERS: INHALATION_SPRAY | RESPIRATORY_TRACT | 1 refills | Status: DC

## 2021-03-13 NOTE — Progress Notes (Signed)
   SUBJECTIVE:   CHIEF COMPLAINT / HPI: physical  Elevated blood pressure-has been on HCTZ in the remote past.  Asymptomatic and not on current controller medication.  Checked at pharmacy about 2 weeks ago and was 120 over something.  Does not have a home monitor.  Weight loss-patient currently taking phentermine for weight loss and endorses about 10 pound weight loss.  She is interested in surgery referral.  Does not smoke Socially drink- couple times per year Drugs- none  sexually active- nexplanon in place. Needs it replaced soon and already scheduled.  No concerns for STDs  Married- feels safe at home  Asthma- uses inhaler before exercising.  Never had allergy testing.  Benadryl at night.  Claritin has not tried yet  OBJECTIVE:   BP (!) 150/90   Pulse 93   Ht 5\' 4"  (1.626 m)   Wt 234 lb 2 oz (106.2 kg)   LMP 02/20/2021   SpO2 99%   BMI 40.19 kg/m   General: NAD, pleasant, able to participate in exam Cardiac: RRR, normal heart sounds, no murmurs. 2+ radial and PT pulses bilaterally Respiratory: CTAB, normal effort, mild scattered wheezes, no rales or rhonchi Abdomen: soft, nontender, nondistended, no hepatic or splenomegaly, +BS Extremities: no edema. WWP. Skin: warm and dry, no rashes noted Neuro: alert and oriented, no focal deficits Psych: Normal affect and mood  ASSESSMENT/PLAN:   Exercise-induced asthma Refilled albuterol  Severe obesity (BMI >= 40) Referral for bariatric surgery placed today Discussed nutrition Recommended discontinuation of phentermine due to vital sign abnormalities likely related and patient seemed accepting of this plan.  She states that she has already started tapering down the medication.  HTN (hypertension) History of hypertension but has not been on medication for over a year. Blood pressure is elevated in office today to 150/90 and heart rate of 93.  She is asymptomatic. Benign physical exam. Vital sign abnormalities likely  related to phentermine dose which she took this morning but also has had elevated blood pressures in the past. Monitored her blood pressure at pharmacy 2 weeks ago which was within normal limits. Recommended patient get home blood pressure monitoring cuff and described in detail proper monitoring procedures. Asked patient to take journal of these readings and message via MyChart or return with them in her follow-up visit to recheck her blood pressure. If still continues to be elevated off of the phentermine on her home readings, will suggest restarting a blood pressure medication. Hopefully, in conjunction with weight loss we can get her blood pressure under control     02/22/2021, DO [facility]

## 2021-03-13 NOTE — Patient Instructions (Signed)
It was a pleasure to see you today!  To summarize our discussion for this visit:  For your elevated blood pressure today, I would like to have you monitor it at home with a upper arm cuff which you can get at the pharmacy or [facility] or other source.  Please take it in the morning and evening for 3 days in a row and keep a diary of these readings. We can talk about it at your next visit in about 2 weeks.  I would recommend to slowly wean off your phentermine medication as you already are  I am sending in a referral to surgery to further discuss bariatric surgery.   We will do your Tdap vaccine today   Please return to our clinic to see me in about 2 weeks.  Call the clinic at [phone] if your symptoms worsen or you have any concerns.   Thank you for allowing me to take part in your care,  Dr. Jamelle Rushing

## 2021-03-14 DIAGNOSIS — J45990 Exercise induced bronchospasm: Secondary | ICD-10-CM | POA: Insufficient documentation

## 2021-03-14 NOTE — Assessment & Plan Note (Signed)
History of hypertension but has not been on medication for over a year. Blood pressure is elevated in office today to 150/90 and heart rate of 93.  She is asymptomatic. Benign physical exam. Vital sign abnormalities likely related to phentermine dose which she took this morning but also has had elevated blood pressures in the past. Monitored her blood pressure at pharmacy 2 weeks ago which was within normal limits. Recommended patient get home blood pressure monitoring cuff and described in detail proper monitoring procedures. Asked patient to take journal of these readings and message via MyChart or return with them in her follow-up visit to recheck her blood pressure. If still continues to be elevated off of the phentermine on her home readings, will suggest restarting a blood pressure medication. Hopefully, in conjunction with weight loss we can get her blood pressure under control

## 2021-03-14 NOTE — Assessment & Plan Note (Signed)
Referral for bariatric surgery placed today Discussed nutrition Recommended discontinuation of phentermine due to vital sign abnormalities likely related and patient seemed accepting of this plan.  She states that she has already started tapering down the medication.

## 2021-03-14 NOTE — Assessment & Plan Note (Signed)
Refilled albuterol 

## 2021-03-27 ENCOUNTER — Encounter: Payer: Self-pay | Admitting: Student in an Organized Health Care Education/Training Program

## 2021-03-27 ENCOUNTER — Other Ambulatory Visit: Payer: Self-pay

## 2021-03-27 ENCOUNTER — Ambulatory Visit (INDEPENDENT_AMBULATORY_CARE_PROVIDER_SITE_OTHER): Payer: 59 | Admitting: Student in an Organized Health Care Education/Training Program

## 2021-03-27 VITALS — BP 145/90 | HR 100 | Wt 247.0 lb

## 2021-03-27 DIAGNOSIS — I10 Essential (primary) hypertension: Secondary | ICD-10-CM | POA: Diagnosis not present

## 2021-03-27 DIAGNOSIS — Z79899 Other long term (current) drug therapy: Secondary | ICD-10-CM

## 2021-03-27 DIAGNOSIS — Z30011 Encounter for initial prescription of contraceptive pills: Secondary | ICD-10-CM | POA: Diagnosis not present

## 2021-03-27 MED ORDER — NORGESTIMATE-ETH ESTRADIOL 0.25-35 MG-MCG PO TABS: 1.0000 | ORAL_TABLET | Freq: Every day | ORAL | 11 refills | Status: DC

## 2021-03-27 MED ORDER — LOSARTAN POTASSIUM 25 MG PO TABS: 25.0000 mg | ORAL_TABLET | Freq: Every day | ORAL | 0 refills | Status: DC

## 2021-03-27 MED ORDER — AMLODIPINE BESYLATE 5 MG PO TABS: 5.0000 mg | ORAL_TABLET | Freq: Every day | ORAL | 0 refills | Status: DC

## 2021-03-27 NOTE — Patient Instructions (Signed)
It was a pleasure to see you today!  To summarize our discussion for this visit:  We removed your nexplanon and will start OCPs today  No submersion for a couple weeks. Can shower like regular. Keep pressure bandage on for about 4-6 hours. Call or go to ED if experiencing significant pain/bleeding/swelling or other abnormal findings  Starting amlodipine for blood pressure. Please continue to monitor at home and should see improvement in about a week or so. We can follow up in person in about a month. Keep recordings of home readings as you did before.  Referral information:  [facility] [address] [facility] [address] [place] [address]  [phone]  Some additional health maintenance measures we should update are: Health Maintenance Due  Topic Date Due  . Hepatitis C Screening  Never done  . PAP SMEAR-Modifier  02/19/2020  .    Call the clinic at [phone] if your symptoms worsen or you have any concerns.   Thank you for allowing me to take part in your care,  Dr. Jamelle Rushing

## 2021-03-27 NOTE — Progress Notes (Signed)
   SUBJECTIVE:   CHIEF COMPLAINT / HPI: nexplanon removal  HTN- asymptomatic. Remains elevated at home as viewed on home readings shared on mychart even after discontinuing weight loss supplement.   Contraception- desires to remove nexplanon and instead use OCPs at this time. Has no preference. Does not recall name of last OCP she used.  OBJECTIVE:   BP (!) 145/90   Pulse 100   Wt 247 lb (112 kg)   SpO2 99%   BMI 42.40 kg/m   Physical Exam Vitals and nursing note reviewed. Exam conducted with a chaperone present.  Constitutional:      Appearance: She is obese.  HENT:     Head: Atraumatic.  Eyes:     Conjunctiva/sclera: Conjunctivae normal.  Cardiovascular:     Rate and Rhythm: Normal rate and regular rhythm.     Pulses: Normal pulses.     Heart sounds: Normal heart sounds.  Pulmonary:     Effort: Pulmonary effort is normal.  Musculoskeletal:     Cervical back: Normal range of motion.     Comments: FB left upper arm identified as nexplanon.  S/p removal- inspected device and it was removed completely. Incision site approximately 1cm and had no bleeding prior to applying pressure bandage  Skin:    General: Skin is warm and dry.     Capillary Refill: Capillary refill takes less than 2 seconds.  Neurological:     General: No focal deficit present.     Mental Status: She is alert and oriented to person, place, and time.    ASSESSMENT/PLAN:   Hypertension Initially discussed ARB treatment but since we removed nexplanon today and switching to OCPs, I recommended instead amlodipine since it is a less reliable method of contraception. Patient understood and agreed with plan.  - amlodipine 5mg  daily - f/u 1 month  - continue home readings log  Contraception management Patient tolerated removal of nexplanon well without complications.  Prescribed OCP     , DO [facility]   Informed consent obtained and placed in chart.   Discussed possible complications including bleeding, infection, retained foreign body, etc.  Patient elected to proceed with the procedure in light of these possible complications. Proper time-out conducted.  Wiped the prior insertion site with alcohol wipe x3 and proceeded to inject approximately 4 cc of lidocaine 1% with epi. After proper anestheasia achieved, proceeded to make approximately 1 cm incision along prior scar from insertion to expose subcutaneous tissue.  Quickly located the device with forceps and removed scar tissue with scalpel and easily removed complete device which was inspected to ensure not missing any pieces and was shown to the patient.  Patient tolerated the procedure very well.  Incision site had spontaneous hemostasis.  No immediate complications were observed.  Incision was covered with sterile bandage and pressure dressing. Patient given instructions regarding fevers, signs of infection, nausea/vomiting, or other reasons to call/return to clinic.

## 2021-03-28 MED ORDER — AMLODIPINE BESYLATE 5 MG PO TABS: 5.0000 mg | ORAL_TABLET | Freq: Every day | ORAL | 0 refills | Status: DC

## 2021-03-28 MED ORDER — NORGESTIMATE-ETH ESTRADIOL 0.25-35 MG-MCG PO TABS: 1.0000 | ORAL_TABLET | Freq: Every day | ORAL | 11 refills | Status: DC

## 2021-03-28 NOTE — Telephone Encounter (Signed)
Per patient request, called and canceled rx at [facility]. Resent prescriptions to patient's preferred pharmacy ([facility] [address]).   Veronda Prude, RN

## 2021-03-29 DIAGNOSIS — Z309 Encounter for contraceptive management, unspecified: Secondary | ICD-10-CM | POA: Insufficient documentation

## 2021-03-29 NOTE — Assessment & Plan Note (Signed)
Initially discussed ARB treatment but since we removed nexplanon today and switching to OCPs, I recommended instead amlodipine since it is a less reliable method of contraception. Patient understood and agreed with plan.  - amlodipine 5mg  daily - f/u 1 month  - continue home readings log

## 2021-03-29 NOTE — Assessment & Plan Note (Signed)
Patient tolerated removal of nexplanon well without complications.  Prescribed OCP

## 2021-05-01 ENCOUNTER — Other Ambulatory Visit: Payer: Self-pay | Admitting: Student in an Organized Health Care Education/Training Program

## 2021-05-01 DIAGNOSIS — J4520 Mild intermittent asthma, uncomplicated: Secondary | ICD-10-CM

## 2021-05-04 ENCOUNTER — Encounter: Payer: Self-pay | Admitting: Student in an Organized Health Care Education/Training Program

## 2021-05-05 ENCOUNTER — Other Ambulatory Visit: Payer: Self-pay | Admitting: Student in an Organized Health Care Education/Training Program

## 2021-05-05 DIAGNOSIS — J4520 Mild intermittent asthma, uncomplicated: Secondary | ICD-10-CM

## 2021-05-05 MED ORDER — ALBUTEROL SULFATE HFA 108 (90 BASE) MCG/ACT IN AERS: INHALATION_SPRAY | RESPIRATORY_TRACT | 1 refills | Status: DC

## 2021-06-23 ENCOUNTER — Other Ambulatory Visit: Payer: Self-pay | Admitting: Student in an Organized Health Care Education/Training Program

## 2021-07-04 ENCOUNTER — Other Ambulatory Visit: Payer: Self-pay | Admitting: Student in an Organized Health Care Education/Training Program

## 2021-07-04 DIAGNOSIS — J4520 Mild intermittent asthma, uncomplicated: Secondary | ICD-10-CM

## 2021-07-18 DIAGNOSIS — I10 Essential (primary) hypertension: Secondary | ICD-10-CM | POA: Diagnosis not present

## 2021-07-18 DIAGNOSIS — J4520 Mild intermittent asthma, uncomplicated: Secondary | ICD-10-CM | POA: Diagnosis not present

## 2021-07-24 ENCOUNTER — Other Ambulatory Visit: Payer: Self-pay | Admitting: Student in an Organized Health Care Education/Training Program

## 2021-07-31 DIAGNOSIS — E559 Vitamin D deficiency, unspecified: Secondary | ICD-10-CM | POA: Diagnosis not present

## 2021-07-31 DIAGNOSIS — I10 Essential (primary) hypertension: Secondary | ICD-10-CM | POA: Diagnosis not present

## 2021-07-31 DIAGNOSIS — Z6841 Body Mass Index (BMI) 40.0 and over, adult: Secondary | ICD-10-CM | POA: Diagnosis not present

## 2021-07-31 DIAGNOSIS — E669 Obesity, unspecified: Secondary | ICD-10-CM | POA: Diagnosis not present

## 2021-08-27 ENCOUNTER — Other Ambulatory Visit: Payer: Self-pay | Admitting: Family Medicine

## 2021-09-01 ENCOUNTER — Other Ambulatory Visit: Payer: Self-pay | Admitting: Family Medicine

## 2021-09-01 DIAGNOSIS — J4520 Mild intermittent asthma, uncomplicated: Secondary | ICD-10-CM

## 2021-10-13 DIAGNOSIS — Z111 Encounter for screening for respiratory tuberculosis: Secondary | ICD-10-CM | POA: Diagnosis not present

## 2021-10-13 DIAGNOSIS — Z23 Encounter for immunization: Secondary | ICD-10-CM | POA: Diagnosis not present

## 2021-10-15 DIAGNOSIS — Z111 Encounter for screening for respiratory tuberculosis: Secondary | ICD-10-CM | POA: Diagnosis not present

## 2021-10-24 ENCOUNTER — Other Ambulatory Visit: Payer: Self-pay | Admitting: Family Medicine

## 2021-10-24 DIAGNOSIS — J4520 Mild intermittent asthma, uncomplicated: Secondary | ICD-10-CM

## 2021-12-19 ENCOUNTER — Other Ambulatory Visit: Payer: Self-pay | Admitting: Family Medicine

## 2021-12-19 DIAGNOSIS — J4520 Mild intermittent asthma, uncomplicated: Secondary | ICD-10-CM

## 2022-02-01 ENCOUNTER — Other Ambulatory Visit: Payer: Self-pay

## 2022-02-01 DIAGNOSIS — J4520 Mild intermittent asthma, uncomplicated: Secondary | ICD-10-CM

## 2022-02-01 MED ORDER — ALBUTEROL SULFATE HFA 108 (90 BASE) MCG/ACT IN AERS: INHALATION_SPRAY | RESPIRATORY_TRACT | 1 refills | Status: DC

## 2022-02-01 MED ORDER — NORGESTIMATE-ETH ESTRADIOL 0.25-35 MG-MCG PO TABS: 1.0000 | ORAL_TABLET | Freq: Every day | ORAL | 11 refills | Status: DC

## 2022-02-01 NOTE — Addendum Note (Signed)
Addended by: Veronda Prude on: 02/01/2022 01:37 PM   Modules accepted: Orders

## 2022-02-01 NOTE — Telephone Encounter (Signed)
Patient calls nurse line requesting refills on birth control and inhaler. Patient has upcoming appointment on 03/16/2022.   Please advise if refills can be sent in.   Veronda Prude, RN
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
## 2022-03-12 NOTE — Patient Instructions (Signed)
It was wonderful to see you today.  Please bring ALL of your medications with you to every visit.   Today we talked about:  -We performed your Pap smear today. I will let you know the results and any next steps via MyChart.  -We are doing routine testing to check for sexually transmitted infections, as well as screening you for Hepatitis C, HIV, syphilis, Hepatitis B, bacterial vaginosis, yeast. We are also checking your thyroid, cholesterol and doing screening to check for diabetes. -I have refilled your Amlodipine for 1 year. -Return in 1 year or sooner if any concerns.  Today at your annual preventive visit we talked about the following measures  I recommend 150 minutes of exercise per week-try 30 minutes 5 days per week We discussed reducing sugary beverages (like soda and juice) and increasing leafy greens and whole fruits.  We discussed avoiding tobacco and alcohol.  I recommend avoiding illicit substances.  Your blood pressure is 124/82 at goal.   Thank you for choosing [facility].   Please call [phone] with any questions about today's appointment.  Please be sure to schedule follow up at the front  desk before you leave today.   Sabino Dick, DO PGY-2 Family Medicine

## 2022-03-16 ENCOUNTER — Other Ambulatory Visit: Payer: Self-pay

## 2022-03-16 ENCOUNTER — Ambulatory Visit (INDEPENDENT_AMBULATORY_CARE_PROVIDER_SITE_OTHER): Payer: 59 | Admitting: Family Medicine

## 2022-03-16 ENCOUNTER — Other Ambulatory Visit (HOSPITAL_COMMUNITY)
Admission: RE | Admit: 2022-03-16 | Discharge: 2022-03-16 | Disposition: A | Discharge status: Home / Self Care | Payer: 59 | Source: Ambulatory Visit | Attending: Family Medicine | Admitting: Family Medicine

## 2022-03-16 ENCOUNTER — Encounter: Payer: Self-pay | Admitting: Family Medicine

## 2022-03-16 VITALS — BP 124/82 | Ht 64.0 in | Wt 256.0 lb

## 2022-03-16 DIAGNOSIS — Z124 Encounter for screening for malignant neoplasm of cervix: Secondary | ICD-10-CM | POA: Diagnosis not present

## 2022-03-16 DIAGNOSIS — Z1159 Encounter for screening for other viral diseases: Secondary | ICD-10-CM

## 2022-03-16 DIAGNOSIS — Z113 Encounter for screening for infections with a predominantly sexual mode of transmission: Secondary | ICD-10-CM

## 2022-03-16 DIAGNOSIS — Z114 Encounter for screening for human immunodeficiency virus [HIV]: Secondary | ICD-10-CM | POA: Diagnosis not present

## 2022-03-16 DIAGNOSIS — I10 Essential (primary) hypertension: Secondary | ICD-10-CM

## 2022-03-16 DIAGNOSIS — Z6841 Body Mass Index (BMI) 40.0 and over, adult: Secondary | ICD-10-CM | POA: Diagnosis not present

## 2022-03-16 DIAGNOSIS — E049 Nontoxic goiter, unspecified: Secondary | ICD-10-CM

## 2022-03-16 LAB — POCT GLYCOSYLATED HEMOGLOBIN (HGB A1C): Hemoglobin A1C: 5.6 % (ref 4.0–5.6)

## 2022-03-16 MED ORDER — AMLODIPINE BESYLATE 5 MG PO TABS
5.0000 mg | ORAL_TABLET | Freq: Every day | ORAL | 3 refills | Status: DC
Start: 2022-03-16 — End: 2022-05-09

## 2022-03-16 NOTE — Assessment & Plan Note (Signed)
On Amlodpine. Reports good compliance without adverse effects. BP today 124/82. ?-Refilled for 1 year ?-Continue ?

## 2022-03-16 NOTE — Assessment & Plan Note (Signed)
Exercising 70 minutes a week doing cardio and weights. Discussed recommendations of 150 minutes/week of moderate intensity exercise. Discussed dietary recommendations. Would benefit from [facility] or [facility] but she states insurance does not cover. Could consider starting Metformin for weight loss, especially if she is prediabetic. ?-Checking A1c, lipids, TSH ?

## 2022-03-17 LAB — HIV ANTIBODY (ROUTINE TESTING W REFLEX): HIV Screen 4th Generation wRfx: NONREACTIVE

## 2022-03-17 LAB — HEPATITIS B SURFACE ANTIGEN: Hepatitis B Surface Ag: NEGATIVE

## 2022-03-17 LAB — BASIC METABOLIC PANEL
BUN/Creatinine Ratio: 15 (ref 9–23)
BUN: 11 mg/dL (ref 6–20)
CO2: 19 mmol/L — ABNORMAL LOW (ref 20–29)
Calcium: 9.6 mg/dL (ref 8.7–10.2)
Chloride: 105 mmol/L (ref 96–106)
Creatinine, Ser: 0.72 mg/dL (ref 0.57–1.00)
Glucose: 107 mg/dL — ABNORMAL HIGH (ref 70–99)
Potassium: 4.5 mmol/L (ref 3.5–5.2)
Sodium: 144 mmol/L (ref 134–144)
eGFR: 115 mL/min/{1.73_m2} (ref >59)

## 2022-03-17 LAB — TSH: TSH: 0.716 u[IU]/mL (ref 0.450–4.500)

## 2022-03-17 LAB — HCV INTERPRETATION

## 2022-03-17 LAB — RPR: RPR Ser Ql: NONREACTIVE

## 2022-03-17 LAB — HCV AB W REFLEX TO QUANT PCR: HCV Ab: NONREACTIVE

## 2022-03-19 LAB — CYTOLOGY - PAP
Chlamydia: NEGATIVE
Comment: NEGATIVE
Comment: NEGATIVE
Comment: NEGATIVE
Comment: NORMAL
Diagnosis: NEGATIVE
High risk HPV: NEGATIVE
Neisseria Gonorrhea: NEGATIVE
Trichomonas: NEGATIVE

## 2022-03-24 ENCOUNTER — Other Ambulatory Visit: Payer: Self-pay | Admitting: Family Medicine

## 2022-03-24 MED ORDER — METFORMIN HCL ER 500 MG PO TB24: 500.0000 mg | ORAL_TABLET | Freq: Every day | ORAL | 1 refills | Status: DC

## 2022-04-30 ENCOUNTER — Telehealth: Payer: Self-pay | Admitting: Family Medicine

## 2022-04-30 NOTE — Telephone Encounter (Signed)
Reviewed form and placed in PCP's box for completion.  .Leib Elahi R Auriella Wieand, CMA  

## 2022-04-30 NOTE — Telephone Encounter (Signed)
Patient dropped off form at front desk for Staff Health Assessment/Medical Report.  Verified that patient section of form has been completed.  Last DOS/WCC with PCP was 03/16/22.  Placed form in red team folder to be completed by clinical staff.  Vilinda Blanks  ?

## 2022-05-03 NOTE — Telephone Encounter (Signed)
Copy placed in batch scanning, ? ?Original up front for pickup. ? ?Pt informed. Christen Bame, CMA ? ?

## 2022-05-04 ENCOUNTER — Other Ambulatory Visit: Payer: Self-pay | Admitting: Family Medicine

## 2022-05-04 DIAGNOSIS — J4520 Mild intermittent asthma, uncomplicated: Secondary | ICD-10-CM

## 2022-05-04 NOTE — Telephone Encounter (Signed)
Patient walked in to request refill of: ? ?Name of Medication(s):  Albuterol inhaler  ?Last date of OV:  03/16/22 ?Pharmacy:  Same ? ?Will route refill request to Clinic RN.  Discussed with patient policy to call pharmacy for future refills.  Also, discussed refills may take up to 48 hours to approve or deny.  [name]  ?

## 2022-05-07 MED ORDER — ALBUTEROL SULFATE HFA 108 (90 BASE) MCG/ACT IN AERS: INHALATION_SPRAY | RESPIRATORY_TRACT | 1 refills | Status: DC

## 2022-05-09 ENCOUNTER — Encounter: Payer: Self-pay | Admitting: Family Medicine

## 2022-05-09 ENCOUNTER — Ambulatory Visit (INDEPENDENT_AMBULATORY_CARE_PROVIDER_SITE_OTHER): Payer: 59 | Admitting: Family Medicine

## 2022-05-09 VITALS — BP 144/90 | HR 92 | Ht 64.0 in | Wt 261.4 lb

## 2022-05-09 DIAGNOSIS — Z3009 Encounter for other general counseling and advice on contraception: Secondary | ICD-10-CM | POA: Diagnosis not present

## 2022-05-09 DIAGNOSIS — I10 Essential (primary) hypertension: Secondary | ICD-10-CM

## 2022-05-09 MED ORDER — AMLODIPINE BESYLATE 10 MG PO TABS: 10.0000 mg | ORAL_TABLET | Freq: Every day | ORAL | 0 refills | Status: DC

## 2022-05-09 NOTE — Assessment & Plan Note (Addendum)
BP currently above goal. 165/97 in clinic today with repeat measurement 144/90. Majority of home readings also elevated above [id number] systolic. ?-Increase amlodipine to 10mg  daily ?-Continue home BP monitoring ?-Form completed stating patient has adequate BP control for employment (see media tab for scan) ?-Follow up with PCP in 2 weeks (see contraception below) ?

## 2022-05-09 NOTE — Patient Instructions (Addendum)
It was great to meet you! ? ?Your blood pressure was elevated today. It seems like it's also elevated at home on a regular basis. Ideally we'd like your blood pressure below 130/80. ? ?We will increase the dose of your amlodipine from 5mg  daily to 10mg  daily. Continue checking your blood pressure regularly at home and reach out if you have any concerns. ? ?Please follow up in 1 month. ? ? ?Take care, ? ?Dr. ?[facility]  ?

## 2022-05-09 NOTE — Progress Notes (Signed)
    SUBJECTIVE:   CHIEF COMPLAINT / HPI:   Discuss Blood Pressure Patient had pre-employment physical today and her blood pressure was elevated to 169 systolic. Unsure of diastolic. She has known history of hypertension Home medications include: amlodipine 5mg  daily Patient reports excellent compliance, no missed doses No adverse effects noted with amlodipine Patient does check blood pressure at home, usually once weekly. Home BP readings: 140s systolic. She is unsure about diastolic readings. Patient has had a BMP in the past 1 year.   PERTINENT  PMH / PSH: Obesity, asthma, HTN, migraines  OBJECTIVE:   BP (!) 144/90   Pulse 92   Ht 5\' 4"  (1.626 m)   Wt 261 lb 6 oz (118.6 kg)   LMP 04/24/2022   SpO2 100%   BMI 44.86 kg/m   General: NAD, pleasant, able to participate in exam Respiratory: No respiratory distress Skin: warm and dry, no rashes noted Psych: Normal affect and mood Neuro: grossly intact   ASSESSMENT/PLAN:   Hypertension BP currently above goal. 165/97 in clinic today with repeat measurement 144/90. Majority of home readings also elevated above 140 systolic. -Increase amlodipine to 10mg  daily -Continue home BP monitoring -Form completed stating patient has adequate BP control for employment (see media tab for scan) -Follow up with PCP in 2 weeks (see contraception below)  Contraception management Currently taking combined OCPs which are not recommended due to hx of HTN. Discussed alternative contraception methods and patient reports she would like to think about it. Has had Nexplanon and depo in the past- not interested in resuming either of these. Leaning toward IUD, but also considering POPs. Has follow up appt w/PCP in 2 weeks to discuss further.   , MD [facility]

## 2022-05-10 NOTE — Assessment & Plan Note (Signed)
Currently taking combined OCPs which are not recommended due to hx of HTN. Discussed alternative contraception methods and patient reports she would like to think about it. Has had Nexplanon and depo in the past- not interested in resuming either of these. Leaning toward IUD, but also considering POPs. Has follow up appt w/PCP in 2 weeks to discuss further. ?

## 2022-05-18 NOTE — Progress Notes (Deleted)
    SUBJECTIVE:   CHIEF COMPLAINT / HPI:   Hypertension Amlodipine increased to 10 mg at last visit due to persistently elevated BP. She has a home blood pressure cuff and takes readings consistently, they range ***.  Denies chest pain, shortness of breath, lower extremity edema, vision changes, headache.  Contraception Currently on OCP which is contraindicated with her hypertension. She was last seen 5/10 by Dr. Anner Crete who introduced other forms of contraception. Patient has had a Nexplanon and Depo in the past. ***   PERTINENT  PMH / PSH:  Past Medical History:  Diagnosis Date   Asthma    Hypertension    Obesity     OBJECTIVE:   LMP 04/24/2022  ***  General: NAD, pleasant, able to participate in exam Cardiac: RRR, no murmurs. Respiratory: CTAB, normal effort, No wheezes, rales or rhonchi Abdomen: Bowel sounds present, nontender, nondistended, no hepatosplenomegaly. Extremities: no edema or cyanosis. Skin: warm and dry, no rashes noted Neuro: alert, no obvious focal deficits Psych: Normal affect and mood  ASSESSMENT/PLAN:   No problem-specific Assessment & Plan notes found for this encounter.     Sabino Dick, DO [facility]

## 2022-05-23 ENCOUNTER — Ambulatory Visit: Payer: 59 | Admitting: Family Medicine

## 2022-06-05 ENCOUNTER — Encounter: Payer: Self-pay | Admitting: *Deleted

## 2022-07-11 ENCOUNTER — Other Ambulatory Visit: Payer: Self-pay | Admitting: Family Medicine

## 2022-07-11 DIAGNOSIS — J4520 Mild intermittent asthma, uncomplicated: Secondary | ICD-10-CM

## 2022-07-30 ENCOUNTER — Telehealth: Payer: 59

## 2022-07-30 ENCOUNTER — Telehealth: Payer: 59 | Admitting: Physician Assistant

## 2022-07-30 DIAGNOSIS — I10 Essential (primary) hypertension: Secondary | ICD-10-CM | POA: Diagnosis not present

## 2022-07-30 MED ORDER — HYDROCHLOROTHIAZIDE 25 MG PO TABS: 25.0000 mg | ORAL_TABLET | Freq: Every day | ORAL | 0 refills | Status: DC

## 2022-07-30 NOTE — Progress Notes (Signed)
Virtual Visit Consent   [name], you are scheduled for a virtual visit with a [facility] provider today. Just as with appointments in the office, your consent must be obtained to participate. Your consent will be active for this visit and any virtual visit you may have with one of our providers in the next 365 days. If you have a MyChart account, a copy of this consent can be sent to you electronically.  As this is a virtual visit, video technology does not allow for your provider to perform a traditional examination. This may limit your provider's ability to fully assess your condition. If your provider identifies any concerns that need to be evaluated in person or the need to arrange testing (such as labs, EKG, etc.), we will make arrangements to do so. Although advances in technology are sophisticated, we cannot ensure that it will always work on either your end or our end. If the connection with a video visit is poor, the visit may have to be switched to a telephone visit. With either a video or telephone visit, we are not always able to ensure that we have a secure connection.  By engaging in this virtual visit, you consent to the provision of healthcare and authorize for your insurance to be billed (if applicable) for the services provided during this visit. Depending on your insurance coverage, you may receive a charge related to this service.  I need to obtain your verbal consent now. Are you willing to proceed with your visit today? [name] has provided verbal consent on 07/30/2022 for a virtual visit (video or telephone). Margaretann Loveless, PA-C  Date: 07/30/2022 9:02 AM  Virtual Visit via Video Note   I, Margaretann Loveless, connected with  [name]  ([id number], [birth year]) on 07/30/22 at  8:45 AM EDT by a video-enabled telemedicine application and verified that I am speaking with the correct person using two identifiers.  Location: Patient: Virtual Visit  Location Patient: Home Provider: Virtual Visit Location Provider: Home Office   I discussed the limitations of evaluation and management by telemedicine and the availability of in person appointments. The patient expressed understanding and agreed to proceed.    History of Present Illness: [name] is a 31 y.o. who identifies as a female who was assigned female at birth, and is being seen today for HTN.  HPI: Hypertension This is a chronic problem. The current episode started more than 1 month ago. The problem is unchanged. The problem is uncontrolled. Associated symptoms include blurred vision (a week ago). Pertinent negatives include no chest pain, headaches, malaise/fatigue, orthopnea, palpitations, peripheral edema, PND or shortness of breath. (Pulsatile tinnitus in left ear ) There are no associated agents to hypertension. Risk factors for coronary artery disease include obesity and sedentary lifestyle. Past treatments include calcium channel blockers. The current treatment provides no improvement. There are no compliance problems.     156/98 average over last 2 weeks 153/95  Problems:  Patient Active Problem List   Diagnosis Date Noted   Contraception management 03/29/2021   Exercise-induced asthma    Birth control 09/26/2015   Hypertension 09/16/2015   Obesity, Class III, BMI 40-49.9 (morbid obesity) (HCC) 09/16/2015   ECZEMA 06/07/2008   MIGRAINE, UNSPEC., W/O INTRACTABLE MIGRAINE 02/27/2007   RHINITIS, ALLERGIC 02/27/2007   Intermittent asthma, well controlled 02/27/2007    Allergies: No Known Allergies Medications:  Current Outpatient Medications:    hydrochlorothiazide (HYDRODIURIL) 25 MG tablet, Take 1 tablet (  25 mg total) by mouth daily., Disp: 90 tablet, Rfl: 0   albuterol (VENTOLIN HFA) 108 (90 Base) MCG/ACT inhaler, INHALE 2 PUFFS BY MOUTH EVERY 6 HOURS AS NEEDED FOR WHEEZE OR SHORTNESS OF BREATH, Disp: 8.5 g, Rfl: 0   amLODipine (NORVASC) 10 MG tablet, Take 1  tablet (10 mg total) by mouth daily., Disp: 90 tablet, Rfl: 0   metFORMIN (GLUCOPHAGE-XR) 500 MG 24 hr tablet, Take 1 tablet (500 mg total) by mouth daily with breakfast., Disp: 180 tablet, Rfl: 1   norgestimate-ethinyl estradiol (SPRINTEC 28) 0.25-35 MG-MCG tablet, Take 1 tablet by mouth daily., Disp: 28 tablet, Rfl: 11  Observations/Objective: Patient is well-developed, well-nourished in no acute distress.  Resting comfortably at home.  Head is normocephalic, atraumatic.  No labored breathing.  Speech is clear and coherent with logical content.  Patient is alert and oriented at baseline.    Assessment and Plan: 1. Primary hypertension - hydrochlorothiazide (HYDRODIURIL) 25 MG tablet; Take 1 tablet (25 mg total) by mouth daily.  Dispense: 90 tablet; Refill: 0  - Continue Amlodipine - Add HCTZ - DASH diet discussed and provided via AVS - Follow up with PCP in 4-6 weeks for BP recheck and labs  Follow Up Instructions: I discussed the assessment and treatment plan with the patient. The patient was provided an opportunity to ask questions and all were answered. The patient agreed with the plan and demonstrated an understanding of the instructions.  A copy of instructions were sent to the patient via MyChart unless otherwise noted below.    The patient was advised to call back or seek an in-person evaluation if the symptoms worsen or if the condition fails to improve as anticipated.  Time:  I spent 16 minutes with the patient via telehealth technology discussing the above problems/concerns.    Margaretann Loveless, PA-C

## 2022-07-30 NOTE — Patient Instructions (Signed)
[name], thank you for joining Margaretann Loveless, PA-C for today's virtual visit.  While this provider is not your primary care provider (PCP), if your PCP is located in our provider database this encounter information will be shared with them immediately following your visit.  Consent: (Patient) [name] provided verbal consent for this virtual visit at the beginning of the encounter.  Current Medications:  Current Outpatient Medications:    albuterol (VENTOLIN HFA) 108 (90 Base) MCG/ACT inhaler, INHALE 2 PUFFS BY MOUTH EVERY 6 HOURS AS NEEDED FOR WHEEZE OR SHORTNESS OF BREATH, Disp: 8.5 g, Rfl: 0   amLODipine (NORVASC) 10 MG tablet, Take 1 tablet (10 mg total) by mouth daily., Disp: 90 tablet, Rfl: 0   metFORMIN (GLUCOPHAGE-XR) 500 MG 24 hr tablet, Take 1 tablet (500 mg total) by mouth daily with breakfast., Disp: 180 tablet, Rfl: 1   norgestimate-ethinyl estradiol (SPRINTEC 28) 0.25-35 MG-MCG tablet, Take 1 tablet by mouth daily., Disp: 28 tablet, Rfl: 11   Medications ordered in this encounter:  No orders of the defined types were placed in this encounter.    *If you need refills on other medications prior to your next appointment, please contact your pharmacy*  Follow-Up: Call back or seek an in-person evaluation if the symptoms worsen or if the condition fails to improve as anticipated.  Other Instructions DASH Eating Plan DASH stands for Dietary Approaches to Stop Hypertension. The DASH eating plan is a healthy eating plan that has been shown to: Reduce high blood pressure (hypertension). Reduce your risk for type 2 diabetes, heart disease, and stroke. Help with weight loss. What are tips for following this plan? Reading food labels Check food labels for the amount of salt (sodium) per serving. Choose foods with less than 5 percent of the Daily Value of sodium. Generally, foods with less than 300 milligrams (mg) of sodium per serving fit into this eating  plan. To find whole grains, look for the word "whole" as the first word in the ingredient list. Shopping Buy products labeled as "low-sodium" or "no salt added." Buy fresh foods. Avoid canned foods and pre-made or frozen meals. Cooking Avoid adding salt when cooking. Use salt-free seasonings or herbs instead of table salt or sea salt. Check with your health care provider or pharmacist before using salt substitutes. Do not fry foods. Cook foods using healthy methods such as baking, boiling, grilling, roasting, and broiling instead. Cook with heart-healthy oils, such as olive, canola, avocado, soybean, or sunflower oil. Meal planning  Eat a balanced diet that includes: 4 or more servings of fruits and 4 or more servings of vegetables each day. Try to fill one-half of your plate with fruits and vegetables. 6-8 servings of whole grains each day. Less than 6 oz (170 g) of lean meat, poultry, or fish each day. A 3-oz (85-g) serving of meat is about the same size as a deck of cards. One egg equals 1 oz (28 g). 2-3 servings of low-fat dairy each day. One serving is 1 cup (237 mL). 1 serving of nuts, seeds, or beans 5 times each week. 2-3 servings of heart-healthy fats. Healthy fats called omega-3 fatty acids are found in foods such as walnuts, flaxseeds, fortified milks, and eggs. These fats are also found in cold-water fish, such as sardines, salmon, and mackerel. Limit how much you eat of: Canned or prepackaged foods. Food that is high in trans fat, such as some fried foods. Food that is high in saturated fat,  such as fatty meat. Desserts and other sweets, sugary drinks, and other foods with added sugar. Full-fat dairy products. Do not salt foods before eating. Do not eat more than 4 egg yolks a week. Try to eat at least 2 vegetarian meals a week. Eat more home-cooked food and less restaurant, buffet, and fast food. Lifestyle When eating at a restaurant, ask that your food be prepared with  less salt or no salt, if possible. If you drink alcohol: Limit how much you use to: 0-1 drink a day for women who are not pregnant. 0-2 drinks a day for men. Be aware of how much alcohol is in your drink. In the [place] one drink equals one 12 oz bottle of beer (355 mL), one 5 oz glass of wine (148 mL), or one 1 oz glass of hard liquor (44 mL). General information Avoid eating more than 2,300 mg of salt a day. If you have hypertension, you may need to reduce your sodium intake to 1,500 mg a day. Work with your health care provider to maintain a healthy body weight or to lose weight. Ask what an ideal weight is for you. Get at least 30 minutes of exercise that causes your heart to beat faster (aerobic exercise) most days of the week. Activities may include walking, swimming, or biking. Work with your health care provider or dietitian to adjust your eating plan to your individual calorie needs. What foods should I eat? Fruits All fresh, dried, or frozen fruit. Canned fruit in natural juice (without added sugar). Vegetables Fresh or frozen vegetables (raw, steamed, roasted, or grilled). Low-sodium or reduced-sodium tomato and vegetable juice. Low-sodium or reduced-sodium tomato sauce and tomato paste. Low-sodium or reduced-sodium canned vegetables. Grains Whole-grain or whole-wheat bread. Whole-grain or whole-wheat pasta. Brown rice. Orpah Cobb. Bulgur. Whole-grain and low-sodium cereals. Pita bread. Low-fat, low-sodium crackers. Whole-wheat flour tortillas. Meats and other proteins Skinless chicken or [place]. Ground chicken or [place]. Pork with fat trimmed off. Fish and seafood. Egg whites. Dried beans, peas, or lentils. Unsalted nuts, nut butters, and seeds. Unsalted canned beans. Lean cuts of beef with fat trimmed off. Low-sodium, lean precooked or cured meat, such as sausages or meat loaves. Dairy Low-fat (1%) or fat-free (skim) milk. Reduced-fat, low-fat, or fat-free cheeses. Nonfat,  low-sodium ricotta or cottage cheese. Low-fat or nonfat yogurt. Low-fat, low-sodium cheese. Fats and oils Soft margarine without trans fats. Vegetable oil. Reduced-fat, low-fat, or light mayonnaise and salad dressings (reduced-sodium). Canola, safflower, olive, avocado, soybean, and sunflower oils. Avocado. Seasonings and condiments Herbs. Spices. Seasoning mixes without salt. Other foods Unsalted popcorn and pretzels. Fat-free sweets. The items listed above may not be a complete list of foods and beverages you can eat. Contact a dietitian for more information. What foods should I avoid? Fruits Canned fruit in a light or heavy syrup. Fried fruit. Fruit in cream or butter sauce. Vegetables Creamed or fried vegetables. Vegetables in a cheese sauce. Regular canned vegetables (not low-sodium or reduced-sodium). Regular canned tomato sauce and paste (not low-sodium or reduced-sodium). Regular tomato and vegetable juice (not low-sodium or reduced-sodium). Rosita Fire. Olives. Grains Baked goods made with fat, such as croissants, muffins, or some breads. Dry pasta or rice meal packs. Meats and other proteins Fatty cuts of meat. Ribs. Fried meat. Tomasa Blase. Bologna, salami, and other precooked or cured meats, such as sausages or meat loaves. Fat from the back of a pig (fatback). Bratwurst. Salted nuts and seeds. Canned beans with added salt. Canned or smoked fish. Whole eggs or  egg yolks. Chicken or [place] with skin. Dairy Whole or 2% milk, cream, and half-and-half. Whole or full-fat cream cheese. Whole-fat or sweetened yogurt. Full-fat cheese. Nondairy creamers. Whipped toppings. Processed cheese and cheese spreads. Fats and oils Butter. Stick margarine. Lard. Shortening. Ghee. Bacon fat. Tropical oils, such as coconut, palm kernel, or palm oil. Seasonings and condiments Onion salt, garlic salt, seasoned salt, table salt, and sea salt. Worcestershire sauce. Tartar sauce. Barbecue sauce. Teriyaki sauce. Soy  sauce, including reduced-sodium. Steak sauce. Canned and packaged gravies. Fish sauce. Oyster sauce. Cocktail sauce. Store-bought horseradish. Ketchup. Mustard. Meat flavorings and tenderizers. Bouillon cubes. Hot sauces. Pre-made or packaged marinades. Pre-made or packaged taco seasonings. Relishes. Regular salad dressings. Other foods Salted popcorn and pretzels. The items listed above may not be a complete list of foods and beverages you should avoid. Contact a dietitian for more information. Where to find more information National Heart, Lung, and Blood Institute: PopSteam.is American Heart Association: www.heart.org Academy of Nutrition and Dietetics: www.eatright.org National Kidney Foundation: www.kidney.org Summary The DASH eating plan is a healthy eating plan that has been shown to reduce high blood pressure (hypertension). It may also reduce your risk for type 2 diabetes, heart disease, and stroke. When on the DASH eating plan, aim to eat more fresh fruits and vegetables, whole grains, lean proteins, low-fat dairy, and heart-healthy fats. With the DASH eating plan, you should limit salt (sodium) intake to 2,300 mg a day. If you have hypertension, you may need to reduce your sodium intake to 1,500 mg a day. Work with your health care provider or dietitian to adjust your eating plan to your individual calorie needs. This information is not intended to replace advice given to you by your health care provider. Make sure you discuss any questions you have with your health care provider. Document Revised: 11/20/2019 Document Reviewed: 11/20/2019 Elsevier Patient Education  2023 Elsevier Inc.    If you have been instructed to have an in-person evaluation today at a local Urgent Care facility, please use the link below. It will take you to a list of all of our available [facility], including address, phone number and hours of operation. Please do not delay care.  [facility]  If you or a family member do not have a primary care provider, use the link below to schedule a visit and establish care. When you choose a [facility] primary care physician or advanced practice provider, you gain a long-term partner in health. Find a Primary Care Provider  Learn more about [facility]'s in-office and virtual care options: [facility] - Get Care Now

## 2022-08-01 ENCOUNTER — Other Ambulatory Visit: Payer: Self-pay | Admitting: Family Medicine

## 2022-08-01 DIAGNOSIS — J4520 Mild intermittent asthma, uncomplicated: Secondary | ICD-10-CM

## 2022-08-07 ENCOUNTER — Telehealth: Payer: 59 | Admitting: Family Medicine

## 2022-08-07 DIAGNOSIS — J4520 Mild intermittent asthma, uncomplicated: Secondary | ICD-10-CM | POA: Diagnosis not present

## 2022-08-07 MED ORDER — ALBUTEROL SULFATE HFA 108 (90 BASE) MCG/ACT IN AERS: INHALATION_SPRAY | RESPIRATORY_TRACT | 0 refills | Status: DC

## 2022-08-07 NOTE — Progress Notes (Signed)
Visit for Asthma  Based on what you have shared with me, it looks like you may have a flare up of your asthma.  Asthma is a chronic (ongoing) lung disease which results in airway obstruction, inflammation and hyper-responsiveness.   Asthma symptoms vary from person to person, with common symptoms including nighttime awakening and decreased ability to participate in normal activities as a result of shortness of breath. It is often triggered by changes in weather, changes in the season, changes in air temperature, or inside (home, school, daycare or work) allergens such as animal dander, mold, mildew, woodstoves or cockroaches.   It can also be triggered by hormonal changes, extreme emotion, physical exertion or an upper respiratory tract illness.     It is important to identify the trigger, and then eliminate or avoid the trigger if possible.   If you have been prescribed medications to be taken on a regular basis, it is important to follow the asthma action plan and to follow guidelines to adjust medication in response to increasing symptoms of decreased peak expiratory flow rate  Treatment: I have prescribed: Albuterol (Proventil HFA; Ventolin HFA) 108 (90 Base) MCG/ACT Inhaler 2 puffs into the lungs every six hours as needed for wheezing or shortness of breath refill  HOME CARE Only take medications as instructed by your medical team. Consider wearing a mask or scarf to improve breathing air temperature have been shown to decrease irritation and decrease exacerbations Get rest. Taking a steamy shower or using a humidifier may help nasal congestion sand ease sore throat pain. You can place a towel over your head and breathe in the steam from hot water coming from a faucet. Using a saline nasal spray works much the same way.  Cough drops, hare candies and sore throat lozenges may ease  your cough.  Avoid close contacts especially the very you and the elderly Cover your mouth if you cough or sneeze Always remember to wash your hands.    GET HELP RIGHT AWAY IF: You develop worsening symptoms; breathlessness at rest, drowsy, confused or agitated, unable to speak in full sentences You have coughing fits You develop a severe headache or visual changes You develop shortness of breath, difficulty breathing or start having chest pain Your symptoms persist after you have completed your treatment plan If your symptoms do not improve within 10 days  MAKE SURE YOU Understand these instructions. Will watch your condition. Will get help right away if you are not doing well or get worse.   Your e-visit answers were reviewed by a board certified advanced clinical practitioner to complete your personal care plan, Depending upon the condition, your plan could have included both over the counter or prescription medications.   Please review your pharmacy choice. Your safety is important to [place]. If you have drug allergies check your prescription carefully.  You can use MyChart to ask questions about today's visit, request a non-urgent  call back, or ask for a work or school excuse for 24 hours related to this e-Visit. If it has been greater than 24 hours you will need to follow up with your provider, or enter a new e-Visit to address those concerns.   You will get an e-mail in the next two days asking about your experience. I hope that your e-visit has been valuable and will speed your recovery. Thank you for using e-visits.  I provided 5 minutes of non face-to-face time during this encounter for chart review, medication and order placement, as  well as and documentation.   

## 2022-10-14 ENCOUNTER — Telehealth: Payer: Self-pay | Admitting: Emergency Medicine

## 2022-10-14 DIAGNOSIS — J45909 Unspecified asthma, uncomplicated: Secondary | ICD-10-CM

## 2022-10-15 MED ORDER — ALBUTEROL SULFATE HFA 108 (90 BASE) MCG/ACT IN AERS: 2.0000 | INHALATION_SPRAY | RESPIRATORY_TRACT | 0 refills | Status: DC | PRN

## 2022-10-15 NOTE — Progress Notes (Signed)
Visit for Asthma  Based on what you have shared with me, it looks like you may have a flare up of your asthma.  Asthma is a chronic (ongoing) lung disease which results in airway obstruction, inflammation and hyper-responsiveness.   Asthma symptoms vary from person to person, with common symptoms including nighttime awakening and decreased ability to participate in normal activities as a result of shortness of breath. It is often triggered by changes in weather, changes in the season, changes in air temperature, or inside (home, school, daycare or work) allergens such as animal dander, mold, mildew, woodstoves or cockroaches.   It can also be triggered by hormonal changes, extreme emotion, physical exertion or an upper respiratory tract illness.     It is important to identify the trigger, and then eliminate or avoid the trigger if possible.   If you have been prescribed medications to be taken on a regular basis, it is important to follow the asthma action plan and to follow guidelines to adjust medication in response to increasing symptoms of decreased peak expiratory flow rate  Treatment: I have prescribed: Albuterol (Proventil HFA; Ventolin HFA) 108 (90 Base) MCG/ACT Inhaler 2 puffs into the lungs every six hours as needed for wheezing or shortness of breath.  If your are having a bad flare up, you can do 2 puffs every 4 hours for the next 2 days.  HOME CARE Only take medications as instructed by your medical team. Consider wearing a mask or scarf to improve breathing air temperature have been shown to decrease irritation and decrease exacerbations Get rest. Taking a steamy shower or using a humidifier may help nasal congestion sand ease sore throat pain. You can place a towel over your head and breathe in the steam from hot water coming from a faucet. Using a saline nasal spray  works much the same way.  Cough drops, hare candies and sore throat lozenges may ease your cough.  Avoid close contacts especially the very you and the elderly Cover your mouth if you cough or sneeze Always remember to wash your hands.    GET HELP RIGHT AWAY IF: You develop worsening symptoms; breathlessness at rest, drowsy, confused or agitated, unable to speak in full sentences You have coughing fits You develop a severe headache or visual changes You develop shortness of breath, difficulty breathing or start having chest pain Your symptoms persist after you have completed your treatment plan If your symptoms do not improve within 10 days  MAKE SURE YOU Understand these instructions. Will watch your condition. Will get help right away if you are not doing well or get worse.   Your e-visit answers were reviewed by a board certified advanced clinical practitioner to complete your personal care plan, Depending upon the condition, your plan could have included both over the counter or prescription medications.   Please review your pharmacy choice. Your safety is important to [place]. If you have drug allergies check your prescription carefully.  You can use MyChart to ask questions about today's visit, request a non-urgent  call back, or ask for a work or school excuse for 24 hours related to this e-Visit. If it has been greater than 24 hours you will need to follow up with your provider, or enter a new e-Visit to address those concerns.   You will get an e-mail in the next two days asking about your experience. I hope that your e-visit has been valuable and will speed your recovery. Thank you for using  e-visits.  Approximately 5 minutes was used in reviewing the patient's chart, questionnaire, prescribing medications, and documentation.

## 2022-10-26 ENCOUNTER — Emergency Department (HOSPITAL_COMMUNITY)
Admission: EM | Admit: 2022-10-26 | Discharge: 2022-10-27 | Discharge status: Against Medical Advice | Payer: Medicaid Other | Attending: Emergency Medicine | Admitting: Emergency Medicine

## 2022-10-26 DIAGNOSIS — Z5321 Procedure and treatment not carried out due to patient leaving prior to being seen by health care provider: Secondary | ICD-10-CM | POA: Insufficient documentation

## 2022-10-27 NOTE — ED Notes (Signed)
Pt told this NT that she did not want to wait anymore and she was going home. Pt seen leaving the ED.

## 2022-10-28 DIAGNOSIS — Z23 Encounter for immunization: Secondary | ICD-10-CM | POA: Diagnosis not present

## 2022-11-03 DIAGNOSIS — J4541 Moderate persistent asthma with (acute) exacerbation: Secondary | ICD-10-CM | POA: Diagnosis not present

## 2022-12-08 DIAGNOSIS — J4520 Mild intermittent asthma, uncomplicated: Secondary | ICD-10-CM | POA: Diagnosis not present

## 2023-01-10 ENCOUNTER — Ambulatory Visit (INDEPENDENT_AMBULATORY_CARE_PROVIDER_SITE_OTHER): Payer: BC Managed Care – PPO | Admitting: Primary Care

## 2023-01-10 ENCOUNTER — Encounter (INDEPENDENT_AMBULATORY_CARE_PROVIDER_SITE_OTHER): Payer: Self-pay | Admitting: Primary Care

## 2023-01-10 VITALS — BP 149/91 | HR 93 | Ht 64.0 in | Wt 263.0 lb

## 2023-01-10 DIAGNOSIS — Z6841 Body Mass Index (BMI) 40.0 and over, adult: Secondary | ICD-10-CM | POA: Diagnosis not present

## 2023-01-10 DIAGNOSIS — I10 Essential (primary) hypertension: Secondary | ICD-10-CM | POA: Diagnosis not present

## 2023-01-10 DIAGNOSIS — Z309 Encounter for contraceptive management, unspecified: Secondary | ICD-10-CM

## 2023-01-10 DIAGNOSIS — J45909 Unspecified asthma, uncomplicated: Secondary | ICD-10-CM | POA: Diagnosis not present

## 2023-01-10 MED ORDER — HYDROCHLOROTHIAZIDE 25 MG PO TABS: 25.0000 mg | ORAL_TABLET | Freq: Every day | ORAL | 0 refills | Status: DC

## 2023-01-10 MED ORDER — AMLODIPINE BESYLATE 10 MG PO TABS: 10.0000 mg | ORAL_TABLET | Freq: Every day | ORAL | 0 refills | Status: DC

## 2023-01-10 MED ORDER — NORGESTIMATE-ETH ESTRADIOL 0.25-35 MG-MCG PO TABS: 1.0000 | ORAL_TABLET | Freq: Every day | ORAL | 11 refills | Status: DC

## 2023-01-10 MED ORDER — ALBUTEROL SULFATE HFA 108 (90 BASE) MCG/ACT IN AERS: 2.0000 | INHALATION_SPRAY | RESPIRATORY_TRACT | 0 refills | Status: DC | PRN

## 2023-01-10 NOTE — Progress Notes (Signed)
New Patient Office Visit  Subjective    Patient ID: [name], female    DOB: [birth year]  Age: 32 y.o. MRN: [id number]  CC:  Chief Complaint  Patient presents with   Establish Care    HPI [name] 32 year old morbid obesity  presents to establish care. Concerns with elevated Bp she is out of HCTZ . Patient has No headache, No chest pain, No abdominal pain - No Nausea, No new weakness tingling or numbness, No Cough - shortness of breath.    Outpatient Encounter Medications as of 01/10/2023  Medication Sig   albuterol (VENTOLIN HFA) 108 (90 Base) MCG/ACT inhaler Inhale 2 puffs into the lungs every 4 (four) hours as needed for wheezing or shortness of breath.   amLODipine (NORVASC) 10 MG tablet Take 1 tablet (10 mg total) by mouth daily.   hydrochlorothiazide (HYDRODIURIL) 25 MG tablet Take 1 tablet (25 mg total) by mouth daily. (Patient not taking: Reported on 01/10/2023)   metFORMIN (GLUCOPHAGE-XR) 500 MG 24 hr tablet Take 1 tablet (500 mg total) by mouth daily with breakfast. (Patient not taking: Reported on 01/10/2023)   norgestimate-ethinyl estradiol (SPRINTEC 28) 0.25-35 MG-MCG tablet Take 1 tablet by mouth daily. (Patient not taking: Reported on 01/10/2023)   No facility-administered encounter medications on file as of 01/10/2023.    Past Medical History:  Diagnosis Date   Asthma    Hypertension    Obesity     Past Surgical History:  Procedure Laterality Date   CHOLECYSTECTOMY     TONSILLECTOMY      Family History  Problem Relation Age of Onset   Anesthesia problems Neg Hx    Hypotension Neg Hx    Malignant hyperthermia Neg Hx    Pseudochol deficiency Neg Hx     Social History   Socioeconomic History   Marital status: Married    Spouse name: Not on file   Number of children: Not on file   Years of education: Not on file   Highest education level: Not on file  Occupational History   Not on file  Tobacco Use   Smoking status: Never    Smokeless tobacco: Never  Vaping Use   Vaping Use: Never used  Substance and Sexual Activity   Alcohol use: Not Currently   Drug use: No   Sexual activity: Yes    Birth control/protection: Inserts  Other Topics Concern   Not on file  Social History Narrative   Not on file   Social Determinants of Health   Financial Resource Strain: Not on file  Food Insecurity: Not on file  Transportation Needs: Not on file  Physical Activity: Not on file  Stress: Not on file  Social Connections: Not on file  Intimate Partner Violence: Not on file    ROS Comprehensive ROS Pertinent positive and negative noted in HPI     Objective    Blood Pressure (Abnormal) 149/91   Pulse 93   Height 5\' 4"  (1.626 m)   Weight 263 lb (119.3 kg)   Last Menstrual Period 01/03/2023 (Exact Date) Comment: Last day of cycle.  Oxygen Saturation 98%   Body Mass Index 45.14 kg/m   Physical Exam Vitals reviewed.  Constitutional:      Appearance: She is obese.  HENT:     Head: Normocephalic.     Right Ear: Tympanic membrane and external ear normal.     Left Ear: Tympanic membrane and external ear normal.  Nose: Nose normal.  Eyes:     Extraocular Movements: Extraocular movements intact.     Pupils: Pupils are equal, round, and reactive to light.  Cardiovascular:     Rate and Rhythm: Normal rate and regular rhythm.  Pulmonary:     Effort: Pulmonary effort is normal.     Breath sounds: Normal breath sounds.  Abdominal:     General: Bowel sounds are normal. There is distension.     Palpations: Abdomen is soft.  Musculoskeletal:        General: Normal range of motion.     Cervical back: Normal range of motion and neck supple.  Skin:    General: Skin is warm and dry.  Neurological:     Mental Status: She is alert and oriented to person, place, and time.  Psychiatric:        Mood and Affect: Mood normal.        Behavior: Behavior normal.        Assessment & Plan:  [name] was seen today for  establish care.  Diagnoses and all orders for this visit:  Asthma, unspecified asthma severity, unspecified whether complicated, unspecified whether persistent -     albuterol (VENTOLIN HFA) 108 (90 Base) MCG/ACT inhaler; Inhale 2 puffs into the lungs every 4 (four) hours as needed for wheezing or shortness of breath.  Primary hypertension BP goal - < 130/80 Explained that having normal blood pressure is the goal and medications are helping to get to goal and maintain normal blood pressure. DIET: Limit salt intake, read nutrition labels to check salt content, limit fried and high fatty foods  Avoid using multisymptom OTC cold preparations that generally contain sudafed which can rise BP. Consult with pharmacist on best cold relief products to use for persons with HTN EXERCISE Discussed incorporating exercise such as walking - 30 minutes most days of the week and can do in 10 minute intervals    -     amLODipine (NORVASC) 10 MG tablet; Take 1 tablet (10 mg total) by mouth daily. -     hydrochlorothiazide (HYDRODIURIL) 25 MG tablet; Take 1 tablet (25 mg total) by mouth daily.  Obesity, Class III, BMI 40-49.9 (morbid obesity) ([place]) Discussed diet and exercise for person with BMI >25. Instructed: You must burn more calories than you eat. Losing 5 percent of your body weight should be considered a success. In the longer term, losing more than 15 percent of your body weight and staying at this weight is an extremely good result. However, keep in mind that even losing 5 percent of your body weight leads to important health benefits, so try not to get discouraged if you're not able to lose more than this. Will recheck weight in 3-6 months.   Encounter for contraceptive management, unspecified type norgestimate-ethinyl estradiol (SPRINTEC 28) 0.25-35 MG-MCG tablet; Take 1 tablet by mouth daily.  Other orders -     norgestimate-ethinyl estradiol ([place] 28) 0.25-35 MG-MCG tablet; Take 1 tablet by  mouth daily.     Kerin Perna, NP

## 2023-01-10 NOTE — Patient Instructions (Signed)
Calorie Counting for Weight Loss Calories are units of energy. Your body needs a certain number of calories from food to keep going throughout the day. When you eat or drink more calories than your body needs, your body stores the extra calories mostly as fat. When you eat or drink fewer calories than your body needs, your body burns fat to get the energy it needs. Calorie counting means keeping track of how many calories you eat and drink each day. Calorie counting can be helpful if you need to lose weight. If you eat fewer calories than your body needs, you should lose weight. Ask your health care provider what a healthy weight is for you. For calorie counting to work, you will need to eat the right number of calories each day to lose a healthy amount of weight per week. A dietitian can help you figure out how many calories you need in a day and will suggest ways to reach your calorie goal. A healthy amount of weight to lose each week is usually 1-2 lb (0.5-0.9 kg). This usually means that your daily calorie intake should be reduced by 500-750 calories. Eating 1,200-1,500 calories a day can help most women lose weight. Eating 1,500-1,800 calories a day can help most men lose weight. What do I need to know about calorie counting? Work with your health care provider or dietitian to determine how many calories you should get each day. To meet your daily calorie goal, you will need to: Find out how many calories are in each food that you would like to eat. Try to do this before you eat. Decide how much of the food you plan to eat. Keep a food log. Do this by writing down what you ate and how many calories it had. To successfully lose weight, it is important to balance calorie counting with a healthy lifestyle that includes regular activity. Where do I find calorie information?  The number of calories in a food can be found on a Nutrition Facts label. If a food does not have a Nutrition Facts label, try  to look up the calories online or ask your dietitian for help. Remember that calories are listed per serving. If you choose to have more than one serving of a food, you will have to multiply the calories per serving by the number of servings you plan to eat. For example, the label on a package of bread might say that a serving size is 1 slice and that there are 90 calories in a serving. If you eat 1 slice, you will have eaten 90 calories. If you eat 2 slices, you will have eaten 180 calories. How do I keep a food log? After each time that you eat, record the following in your food log as soon as possible: What you ate. Be sure to include toppings, sauces, and other extras on the food. How much you ate. This can be measured in cups, ounces, or number of items. How many calories were in each food and drink. The total number of calories in the food you ate. Keep your food log near you, such as in a pocket-sized notebook or on an app or website on your mobile phone. Some programs will calculate calories for you and show you how many calories you have left to meet your daily goal. What are some portion-control tips? Know how many calories are in a serving. This will help you know how many servings you can have of a certain   food. Use a measuring cup to measure serving sizes. You could also try weighing out portions on a kitchen scale. With time, you will be able to estimate serving sizes for some foods. Take time to put servings of different foods on your favorite plates or in your favorite bowls and cups so you know what a serving looks like. Try not to eat straight from a food's packaging, such as from a bag or box. Eating straight from the package makes it hard to see how much you are eating and can lead to overeating. Put the amount you would like to eat in a cup or on a plate to make sure you are eating the right portion. Use smaller plates, glasses, and bowls for smaller portions and to prevent  overeating. Try not to multitask. For example, avoid watching TV or using your computer while eating. If it is time to eat, sit down at a table and enjoy your food. This will help you recognize when you are full. It will also help you be more mindful of what and how much you are eating. What are tips for following this plan? Reading food labels Check the calorie count compared with the serving size. The serving size may be smaller than what you are used to eating. Check the source of the calories. Try to choose foods that are high in protein, fiber, and vitamins, and low in saturated fat, trans fat, and sodium. Shopping Read nutrition labels while you shop. This will help you make healthy decisions about which foods to buy. Pay attention to nutrition labels for low-fat or fat-free foods. These foods sometimes have the same number of calories or more calories than the full-fat versions. They also often have added sugar, starch, or salt to make up for flavor that was removed with the fat. Make a grocery list of lower-calorie foods and stick to it. Cooking Try to cook your favorite foods in a healthier way. For example, try baking instead of frying. Use low-fat dairy products. Meal planning Use more fruits and vegetables. One-half of your plate should be fruits and vegetables. Include lean proteins, such as chicken, turkey, and fish. Lifestyle Each week, aim to do one of the following: 150 minutes of moderate exercise, such as walking. 75 minutes of vigorous exercise, such as running. General information Know how many calories are in the foods you eat most often. This will help you calculate calorie counts faster. Find a way of tracking calories that works for you. Get creative. Try different apps or programs if writing down calories does not work for you. What foods should I eat?  Eat nutritious foods. It is better to have a nutritious, high-calorie food, such as an avocado, than a food with  few nutrients, such as a bag of potato chips. Use your calories on foods and drinks that will fill you up and will not leave you hungry soon after eating. Examples of foods that fill you up are nuts and nut butters, vegetables, lean proteins, and high-fiber foods such as whole grains. High-fiber foods are foods with more than 5 g of fiber per serving. Pay attention to calories in drinks. Low-calorie drinks include water and unsweetened drinks. The items listed above may not be a complete list of foods and beverages you can eat. Contact a dietitian for more information. What foods should I limit? Limit foods or drinks that are not good sources of vitamins, minerals, or protein or that are high in unhealthy fats. These   include: Candy. Other sweets. Sodas, specialty coffee drinks, alcohol, and juice. The items listed above may not be a complete list of foods and beverages you should avoid. Contact a dietitian for more information. How do I count calories when eating out? Pay attention to portions. Often, portions are much larger when eating out. Try these tips to keep portions smaller: Consider sharing a meal instead of getting your own. If you get your own meal, eat only half of it. Before you start eating, ask for a container and put half of your meal into it. When available, consider ordering smaller portions from the menu instead of full portions. Pay attention to your food and drink choices. Knowing the way food is cooked and what is included with the meal can help you eat fewer calories. If calories are listed on the menu, choose the lower-calorie options. Choose dishes that include vegetables, fruits, whole grains, low-fat dairy products, and lean proteins. Choose items that are boiled, broiled, grilled, or steamed. Avoid items that are buttered, battered, fried, or served with cream sauce. Items labeled as crispy are usually fried, unless stated otherwise. Choose water, low-fat milk,  unsweetened iced tea, or other drinks without added sugar. If you want an alcoholic beverage, choose a lower-calorie option, such as a glass of wine or light beer. Ask for dressings, sauces, and syrups on the side. These are usually high in calories, so you should limit the amount you eat. If you want a salad, choose a garden salad and ask for grilled meats. Avoid extra toppings such as bacon, cheese, or fried items. Ask for the dressing on the side, or ask for olive oil and vinegar or lemon to use as dressing. Estimate how many servings of a food you are given. Knowing serving sizes will help you be aware of how much food you are eating at restaurants. Where to find more information Centers for Disease Control and Prevention: www.cdc.gov U.[place]. Department of Agriculture: myplate.gov Summary Calorie counting means keeping track of how many calories you eat and drink each day. If you eat fewer calories than your body needs, you should lose weight. A healthy amount of weight to lose per week is usually 1-2 lb (0.5-0.9 kg). This usually means reducing your daily calorie intake by 500-750 calories. The number of calories in a food can be found on a Nutrition Facts label. If a food does not have a Nutrition Facts label, try to look up the calories online or ask your dietitian for help. Use smaller plates, glasses, and bowls for smaller portions and to prevent overeating. Use your calories on foods and drinks that will fill you up and not leave you hungry shortly after a meal. This information is not intended to replace advice given to you by your health care provider. Make sure you discuss any questions you have with your health care provider. Document Revised: 01/28/2020 Document Reviewed: 01/28/2020 Elsevier Patient Education  2023 [employer]  

## 2023-01-10 NOTE — Progress Notes (Signed)
Discuss possible new blood pressure medications.   Saxenda or a different weight loss medication.  Checks bp at home.

## 2023-02-04 ENCOUNTER — Other Ambulatory Visit (INDEPENDENT_AMBULATORY_CARE_PROVIDER_SITE_OTHER): Payer: Self-pay | Admitting: Primary Care

## 2023-02-04 DIAGNOSIS — J45909 Unspecified asthma, uncomplicated: Secondary | ICD-10-CM

## 2023-02-07 ENCOUNTER — Ambulatory Visit (INDEPENDENT_AMBULATORY_CARE_PROVIDER_SITE_OTHER): Payer: 59

## 2023-02-08 ENCOUNTER — Ambulatory Visit (INDEPENDENT_AMBULATORY_CARE_PROVIDER_SITE_OTHER): Payer: 59

## 2023-02-08 ENCOUNTER — Encounter (INDEPENDENT_AMBULATORY_CARE_PROVIDER_SITE_OTHER): Payer: Self-pay

## 2023-03-23 ENCOUNTER — Other Ambulatory Visit: Payer: Self-pay | Admitting: Family Medicine

## 2023-03-28 ENCOUNTER — Telehealth: Payer: BC Managed Care – PPO | Admitting: Family Medicine

## 2023-03-28 DIAGNOSIS — J029 Acute pharyngitis, unspecified: Secondary | ICD-10-CM | POA: Diagnosis not present

## 2023-03-28 MED ORDER — LIDOCAINE VISCOUS HCL 2 % MT SOLN: 15.0000 mL | OROMUCOSAL | 0 refills | Status: DC | PRN

## 2023-03-28 MED ORDER — AMOXICILLIN 500 MG PO TABS: 500.0000 mg | ORAL_TABLET | Freq: Two times a day (BID) | ORAL | 0 refills | Status: AC

## 2023-03-28 NOTE — Progress Notes (Signed)
E-Visit for Sore Throat - Strep Symptoms  We are sorry that you are not feeling well.  Here is how we plan to help!  Based on what you have shared with me it is likely that you have strep pharyngitis.  Strep pharyngitis is inflammation and infection in the back of the throat.  This is an infection cause by bacteria and is treated with antibiotics.  I have prescribed Amoxicillin 500 mg twice a day for 10 days and 2% Viscous Lidocaine 5 ml gargle and swallow every 4 hours as needed for throat pain. For throat pain, we recommend over the counter oral pain relief medications such as acetaminophen or aspirin, or anti-inflammatory medications such as ibuprofen or naproxen sodium. Topical treatments such as oral throat lozenges or sprays may be used as needed. Strep infections are not as easily transmitted as other respiratory infections, however we still recommend that you avoid close contact with loved ones, especially the very young and elderly.  Remember to wash your hands thoroughly throughout the day as this is the number one way to prevent the spread of infection and wipe down door knobs and counters with disinfectant.   Home Care: Only take medications as instructed by your medical team. Complete the entire course of an antibiotic. Do not take these medications with alcohol. A steam or ultrasonic humidifier can help congestion.  You can place a towel over your head and breathe in the steam from hot water coming from a faucet. Avoid close contacts especially the very young and the elderly. Cover your mouth when you cough or sneeze. Always remember to wash your hands.  Get Help Right Away If: You develop worsening fever or sinus pain. You develop a severe head ache or visual changes. Your symptoms persist after you have completed your treatment plan.  Make sure you Understand these instructions. Will watch your condition. Will get help right away if you are not doing well or get  worse.   Thank you for choosing an e-visit.  Your e-visit answers were reviewed by a board certified advanced clinical practitioner to complete your personal care plan. Depending upon the condition, your plan could have included both over the counter or prescription medications.  Please review your pharmacy choice. Make sure the pharmacy is open so you can pick up prescription now. If there is a problem, you may contact your provider through MyChart messaging and have the prescription routed to another pharmacy.  Your safety is important to us. If you have drug allergies check your prescription carefully.   For the next 24 hours you can use MyChart to ask questions about today's visit, request a non-urgent call back, or ask for a work or school excuse. You will get an email in the next two days asking about your experience. I hope that your e-visit has been valuable and will speed your recovery.  I provided 5 minutes of non face-to-face time during this encounter for chart review, medication and order placement, as well as and documentation.   

## 2023-04-11 ENCOUNTER — Ambulatory Visit (INDEPENDENT_AMBULATORY_CARE_PROVIDER_SITE_OTHER): Payer: 59 | Admitting: Primary Care

## 2023-04-29 DIAGNOSIS — Z1331 Encounter for screening for depression: Secondary | ICD-10-CM | POA: Diagnosis not present

## 2023-04-29 DIAGNOSIS — Z6841 Body Mass Index (BMI) 40.0 and over, adult: Secondary | ICD-10-CM | POA: Diagnosis not present

## 2023-07-31 ENCOUNTER — Telehealth (INDEPENDENT_AMBULATORY_CARE_PROVIDER_SITE_OTHER): Payer: Self-pay | Admitting: Primary Care

## 2023-07-31 NOTE — Telephone Encounter (Signed)
Called to schedule apt. Could not leave VM because mailbox was full.

## 2023-08-02 ENCOUNTER — Telehealth: Payer: 59 | Admitting: Family Medicine

## 2023-08-02 DIAGNOSIS — N3000 Acute cystitis without hematuria: Secondary | ICD-10-CM | POA: Diagnosis not present

## 2023-08-03 MED ORDER — CEPHALEXIN 500 MG PO CAPS: 500.0000 mg | ORAL_CAPSULE | Freq: Two times a day (BID) | ORAL | 0 refills | Status: AC

## 2023-08-03 NOTE — Progress Notes (Signed)

## 2023-08-06 ENCOUNTER — Other Ambulatory Visit (INDEPENDENT_AMBULATORY_CARE_PROVIDER_SITE_OTHER): Payer: Self-pay | Admitting: Primary Care

## 2023-08-06 DIAGNOSIS — I10 Essential (primary) hypertension: Secondary | ICD-10-CM

## 2023-08-07 NOTE — Telephone Encounter (Signed)
Requested Prescriptions  Pending Prescriptions Disp Refills   hydrochlorothiazide (HYDRODIURIL) 25 MG tablet [Pharmacy Med Name: HYDROCHLOROTHIAZIDE 25 MG TAB] 90 tablet 0    Sig: TAKE 1 TABLET (25 MG TOTAL) BY MOUTH DAILY.     Cardiovascular: Diuretics - Thiazide Failed - 08/06/2023  1:31 PM      Failed - Cr in normal range and within 180 days    Creat  Date Value Ref Range Status  09/26/2015 0.88 0.50 - 1.10 mg/dL Final   Creatinine, Ser  Date Value Ref Range Status  03/16/2022 0.72 0.57 - 1.00 mg/dL Final         Failed - K in normal range and within 180 days    Potassium  Date Value Ref Range Status  03/16/2022 4.5 3.5 - 5.2 mmol/L Final         Failed - Na in normal range and within 180 days    Sodium  Date Value Ref Range Status  03/16/2022 144 134 - 144 mmol/L Final         Failed - Last BP in normal range    BP Readings from Last 1 Encounters:  01/10/23 (!) 149/91         Failed - Valid encounter within last 6 months    Recent Outpatient Visits           6 months ago Asthma, unspecified asthma severity, unspecified whether complicated, unspecified whether persistent   [facility] Grayce Sessions, NP

## 2023-10-24 ENCOUNTER — Ambulatory Visit (INDEPENDENT_AMBULATORY_CARE_PROVIDER_SITE_OTHER): Payer: 59 | Admitting: Primary Care

## 2023-10-24 ENCOUNTER — Encounter (INDEPENDENT_AMBULATORY_CARE_PROVIDER_SITE_OTHER): Payer: Self-pay | Admitting: Primary Care

## 2023-10-24 VITALS — BP 143/93 | HR 90 | Resp 16 | Ht 64.0 in | Wt 268.0 lb

## 2023-10-24 DIAGNOSIS — N920 Excessive and frequent menstruation with regular cycle: Secondary | ICD-10-CM | POA: Diagnosis not present

## 2023-10-24 DIAGNOSIS — Z833 Family history of diabetes mellitus: Secondary | ICD-10-CM

## 2023-10-24 DIAGNOSIS — E559 Vitamin D deficiency, unspecified: Secondary | ICD-10-CM | POA: Diagnosis not present

## 2023-10-24 DIAGNOSIS — R6889 Other general symptoms and signs: Secondary | ICD-10-CM

## 2023-10-24 DIAGNOSIS — Z3041 Encounter for surveillance of contraceptive pills: Secondary | ICD-10-CM

## 2023-10-24 DIAGNOSIS — Z23 Encounter for immunization: Secondary | ICD-10-CM | POA: Diagnosis not present

## 2023-10-24 DIAGNOSIS — R5383 Other fatigue: Secondary | ICD-10-CM

## 2023-10-24 DIAGNOSIS — I10 Essential (primary) hypertension: Secondary | ICD-10-CM | POA: Diagnosis not present

## 2023-10-24 DIAGNOSIS — J45909 Unspecified asthma, uncomplicated: Secondary | ICD-10-CM

## 2023-10-24 DIAGNOSIS — Z760 Encounter for issue of repeat prescription: Secondary | ICD-10-CM

## 2023-10-24 DIAGNOSIS — Z0000 Encounter for general adult medical examination without abnormal findings: Secondary | ICD-10-CM

## 2023-10-24 MED ORDER — ALBUTEROL SULFATE HFA 108 (90 BASE) MCG/ACT IN AERS: 2.0000 | INHALATION_SPRAY | RESPIRATORY_TRACT | 1 refills | Status: DC | PRN

## 2023-10-24 MED ORDER — HYDROCHLOROTHIAZIDE 25 MG PO TABS
25.0000 mg | ORAL_TABLET | Freq: Every day | ORAL | 1 refills | Status: DC
Start: 2023-10-24 — End: 2023-10-28

## 2023-10-24 MED ORDER — AMLODIPINE BESYLATE 10 MG PO TABS
10.0000 mg | ORAL_TABLET | Freq: Every day | ORAL | 1 refills | Status: DC
Start: 2023-10-24 — End: 2023-10-28

## 2023-10-24 MED ORDER — NORGESTIMATE-ETH ESTRADIOL 0.25-35 MG-MCG PO TABS
1.0000 | ORAL_TABLET | Freq: Every day | ORAL | 11 refills | Status: DC
Start: 2023-10-24 — End: 2023-10-28

## 2023-10-24 NOTE — Progress Notes (Addendum)
[facility]  [name] is a 32 y.o. female presents to office today for annual physical exam examination.    Concerns today include: 1. Bp and weight which is causing depression - see eats when depressed  Occupation: Case worker, Marital status: M, Substance use: no Diet: none, Exercise: yes recently started back 3 times a week   Health Maintenance  Topic Date Due   HPV VACCINES (3 - 3-dose series) 12/18/2007   COVID-19 Vaccine (4 - 2023-24 season) 09/01/2023   Cervical Cancer Screening (HPV/Pap Cotest)  03/17/2027   DTaP/Tdap/Td (3 - Td or Tdap) 03/14/2031   INFLUENZA VACCINE  Completed   Hepatitis C Screening  Completed   HIV Screening  Completed     Past Medical History:  Diagnosis Date   Asthma    Hypertension    Obesity    Social History   Socioeconomic History   Marital status: Married    Spouse name: Not on file   Number of children: Not on file   Years of education: Not on file   Highest education level: Associate degree: occupational, Scientist, product/process development, or vocational program  Occupational History   Not on file  Tobacco Use   Smoking status: Never   Smokeless tobacco: Never  Vaping Use   Vaping status: Never Used  Substance and Sexual Activity   Alcohol use: Not Currently   Drug use: No   Sexual activity: Yes    Birth control/protection: Inserts  Other Topics Concern   Not on file  Social History Narrative   Not on file   Social Determinants of Health   Financial Resource Strain: Low Risk  (10/23/2023)   Overall Financial Resource Strain (CARDIA)    Difficulty of Paying Living Expenses: Not hard at all  Food Insecurity: No Food Insecurity (10/23/2023)   Hunger Vital Sign    Worried About Running Out of Food in the Last Year: Never true    Ran Out of Food in the Last Year: Never true  Transportation Needs: No Transportation Needs (10/23/2023)   PRAPARE - Administrator, Civil Service (Medical): No    Lack of  Transportation (Non-Medical): No  Physical Activity: Insufficiently Active (10/23/2023)   Exercise Vital Sign    Days of Exercise per Week: 2 days    Minutes of Exercise per Session: 60 min  Stress: No Stress Concern Present (10/23/2023)   [employer] of Occupational Health - Occupational Stress Questionnaire    Feeling of Stress : Only a little  Social Connections: Moderately Isolated (10/23/2023)   Social Connection and Isolation Panel [NHANES]    Frequency of Communication with Friends and Family: More than three times a week    Frequency of Social Gatherings with Friends and Family: Once a week    Attends Religious Services: Never    Database administrator or Organizations: No    Attends Engineer, structural: Not on file    Marital Status: Married  Catering manager Violence: Not At Risk (04/05/2023)   [facility] [employer] [facility]   HITS    Over the last 12 months how often did your partner physically hurt you?: 1    Over the last 12 months how often did your partner insult you or talk down to you?: 1    Over the last 12 months how often did your partner threaten you with physical harm?: 1    Over the last 12 months how often did your  partner scream or curse at you?: 1   Past Surgical History:  Procedure Laterality Date   CHOLECYSTECTOMY     TONSILLECTOMY     Family History  Problem Relation Age of Onset   Anesthesia problems Neg Hx    Hypotension Neg Hx    Malignant hyperthermia Neg Hx    Pseudochol deficiency Neg Hx    Health Maintenance  Topic Date Due   HPV VACCINES (3 - 3-dose series) 12/18/2007   COVID-19 Vaccine (4 - 2023-24 season) 09/01/2023   Cervical Cancer Screening (HPV/Pap Cotest)  03/17/2027   DTaP/Tdap/Td (3 - Td or Tdap) 03/14/2031   INFLUENZA VACCINE  Completed   Hepatitis C Screening  Completed   HIV Screening  Completed     Current Outpatient Medications:    albuterol (VENTOLIN HFA) 108 (90 Base) MCG/ACT  inhaler, INHALE 2 PUFFS INTO THE LUNGS EVERY 4 HOURS AS NEEDED FOR WHEEZING OR SHORTNESS OF BREATH., Disp: 18 each, Rfl: 1   amLODipine (NORVASC) 10 MG tablet, Take 1 tablet (10 mg total) by mouth daily. Please schedule an appointment for further refills., Disp: 30 tablet, Rfl: 0   hydrochlorothiazide (HYDRODIURIL) 25 MG tablet, TAKE 1 TABLET (25 MG TOTAL) BY MOUTH DAILY., Disp: 90 tablet, Rfl: 0   norgestimate-ethinyl estradiol (SPRINTEC 28) 0.25-35 MG-MCG tablet, Take 1 tablet by mouth daily., Disp: 28 tablet, Rfl: 11   lidocaine (XYLOCAINE) 2 % solution, Use as directed 15 mLs in the mouth or throat as needed for mouth pain. (Patient not taking: Reported on 10/24/2023), Disp: 100 mL, Rfl: 0 Outpatient Encounter Medications as of 10/24/2023  Medication Sig   albuterol (VENTOLIN HFA) 108 (90 Base) MCG/ACT inhaler INHALE 2 PUFFS INTO THE LUNGS EVERY 4 HOURS AS NEEDED FOR WHEEZING OR SHORTNESS OF BREATH.   amLODipine (NORVASC) 10 MG tablet Take 1 tablet (10 mg total) by mouth daily. Please schedule an appointment for further refills.   hydrochlorothiazide (HYDRODIURIL) 25 MG tablet TAKE 1 TABLET (25 MG TOTAL) BY MOUTH DAILY.   norgestimate-ethinyl estradiol (SPRINTEC 28) 0.25-35 MG-MCG tablet Take 1 tablet by mouth daily.   lidocaine (XYLOCAINE) 2 % solution Use as directed 15 mLs in the mouth or throat as needed for mouth pain. (Patient not taking: Reported on 10/24/2023)   [DISCONTINUED] metFORMIN (GLUCOPHAGE-XR) 500 MG 24 hr tablet Take 1 tablet (500 mg total) by mouth daily with breakfast. (Patient not taking: Reported on 01/10/2023)   No facility-administered encounter medications on file as of 10/24/2023.    No Known Allergies   ROS: Review of Systems Pertinent items noted in HPI and remainder of comprehensive ROS otherwise negative.    Physical exam BP (!) 143/93 (BP Location: Right Arm, Patient Position: Sitting, Cuff Size: Large)   Pulse 90   Resp 16   Ht 5\' 4"  (1.626 m)   Wt 268  lb (121.6 kg)   SpO2 98%   BMI 46.00 kg/m  General appearance: alert, cooperative, appears stated age, and morbidly obese Head: Normocephalic, without obvious abnormality, atraumatic Eyes: conjunctivae/corneas clear. PERRL, EOM's intact. Fundi benign. Neck: no adenopathy, no carotid bruit, no JVD, supple, symmetrical, trachea midline, and thyroid not enlarged, symmetric, no tenderness/mass/nodules Back: symmetric, no curvature. ROM normal. No CVA tenderness. Lungs: clear to auscultation bilaterally Heart: regular rate and rhythm, S1, S2 normal, no murmur, click, rub or gallop Abdomen: soft, non-tender; bowel sounds normal; no masses,  no organomegaly Extremities: extremities normal, atraumatic, no cyanosis or edema Pulses: 2+ and symmetric Skin: Skin color, texture, turgor normal.  No rashes or lesions Lymph nodes: Cervical, supraclavicular, and axillary nodes normal. Neurologic: Alert and oriented X 3, normal strength and tone. Normal symmetric reflexes. Normal coordination and gait    Assessment/ Plan: Brock Bad Jasinski here for annual physical exam.  Stephanie "Leeloo Toney" was seen today for annual exam.  Diagnoses and all orders for this visit:  Annual physical exam  Encounter for immunization -     Flu vaccine trivalent PF, 6mos and older(Flulaval,Afluria,Fluarix,Fluzone)  Obesity, Class III, BMI 40-49.9 (morbid obesity) (HCC) -     Amb Ref to Medical Weight Management -     Lipid Panel  Primary hypertension BP goal - < 130/80 (out of Bp meds) Explained that having normal blood pressure is the goal and medications are helping to get to goal and maintain normal blood pressure. DIET: Limit salt intake, read nutrition labels to check salt content, limit fried and high fatty foods  Avoid using multisymptom OTC cold preparations that generally contain sudafed which can rise BP. Consult with pharmacist on best cold relief products to use for persons with  HTN EXERCISE Discussed incorporating exercise such as walking - 30 minutes most days of the week and can do in 10 minute intervals    -     amLODipine (NORVASC) 10 MG tablet; Take 1 tablet (10 mg total) by mouth daily. Please schedule an appointment for further refills. -     hydrochlorothiazide (HYDRODIURIL) 25 MG tablet; Take 1 tablet (25 mg total) by mouth daily. -     Lipid Panel  Encounter for medication refill -     amLODipine (NORVASC) 10 MG tablet; Take 1 tablet (10 mg total) by mouth daily. Please schedule an appointment for further refills. -     hydrochlorothiazide (HYDRODIURIL) 25 MG tablet; Take 1 tablet (25 mg total) by mouth daily. -     norgestimate-ethinyl estradiol (SPRINTEC 28) 0.25-35 MG-MCG tablet; Take 1 tablet by mouth daily. -     CMP14+EGFR  Encounter for surveillance of contraceptive pills -     norgestimate-ethinyl estradiol (SPRINTEC 28) 0.25-35 MG-MCG tablet; Take 1 tablet by mouth daily.  Menorrhagia with regular cycle -     CBC with Differential  Vitamin D deficiency -     Vitamin D, 25-hydroxy    Problem with body image 2/2 Other fatigue -     TSH + free T4  Family history of diabetes mellitus (DM) -     Hemoglobin A1c   Asthma, unspecified asthma severity, unspecified whether complicated, unspecified whether persistent  Refilled SABA  No problem-specific Assessment & Plan notes found for this encounter.   Counseled on healthy lifestyle choices, including diet (rich in fruits, vegetables and lean meats and low in salt and simple carbohydrates) and exercise (at least 30 minutes of moderate physical activity daily).  Patient to follow up in 1 year for annual exam or sooner if needed.  The above assessment and management plan was discussed with the patient. The patient verbalized understanding of and has agreed to the management plan. Patient is aware to call the clinic if symptoms persist or worsen. Patient is aware when to return to the clinic  for a follow-up visit. Patient educated on when it is appropriate to go to the emergency department.   This note has been created with Education officer, environmental. Any transcriptional errors are unintentional.   Grayce Sessions, NP 10/24/2023, 10:29 AM

## 2023-10-24 NOTE — Patient Instructions (Addendum)
HPV Vaccine Information for Parents  Human papillomavirus (HPV) is a common virus. It spreads easily from person to person through skin-to-skin or sexual contact. There are many types of HPV viruses. Genital or mucosal HPV can cause warts in the genitals. Cutaneous or nonmucosal HPV can cause warts on the hands or feet. Some genital HPV types may cause cancer. Your child can get a shot to help prevent the HPV types that can cause cancer, genital warts, or warts near the opening of the butt (anus). The vaccine is safe and effective. It is recommended that your child get the vaccine at about 60-85 years of age. Getting the vaccine before your child is sexually active gives them the best protection from HPV through adulthood. How can HPV affect my child? An infection with HPV can cause: Genital warts. Mouth or throat cancer. Cancer of the anus. Cancer of the lowest part of the uterus (cervix), outer female genital area (vulva), or vagina. Cancer of the penis (penile cancer). During pregnancy, HPV can be passed to the baby. This infection can cause warts to form in the baby's throat and mouth. What actions can I take to lower my child's risk for HPV? Have your child get the HPV vaccine before they become sexually active. The best time to get the shot is at around 73-79 years of age. The vaccine may be given to children as young as 33 years old.  If your child gets the vaccine before they are 74 years old, it can be given as 2 shots, 6-12 months apart. In some cases, 3 doses are needed. Your child may need 3 doses if: They get the first dose before they are 33 years old but do not have a second dose within 6-12 months after the first dose. They get their first dose after they are 34 years old. They will need to get the other 2 doses within 6 months of the first dose. They have a weak body defense system (immune system). What are the risks and benefits of the HPV vaccine? Benefits Getting the vaccine  can help prevent certain cancers. These include: Oral cancer. This is cancer of the mouth. Anal cancer. This is cancer of the anus. Cancers of the cervix, vulva, and vagina in females. Penile cancer in males. Your child is less likely to get these cancers if they get the vaccine before they become sexually active. The vaccine also prevents genital warts caused by HPV. Risks In rare cases, side effects and reactions have been reported. These include: Soreness, redness, or swelling at the injection site. Dizziness or fainting. Fever. Headache. Muscle or joint pain. Who should not get the HPV vaccine or wait to get it? Some children should not get the HPV vaccine or should wait to get it. Ask the health care provider if your child should get the vaccine if: Your child has had a severe allergic reaction to other vaccines. Your child is allergic to yeast. Your child has a fever. Your child has had a recent illness. Your child is pregnant or may be pregnant. Where to find more information Centers for Disease Control and Prevention (CDC): TonerPromos.no American Academy of Pediatrics (AAP): healthychildren.org This information is not intended to replace advice given to you by your health care provider. Make sure you discuss any questions you have with your health care provider. Document Revised: 09/14/2022 Document Reviewed: 09/14/2022 Elsevier Patient Education  2024 Elsevier Inc. Influenza Vaccine Injection What is this medication? INFLUENZA VACCINE (in floo EN  zuh vak SEEN) reduces the risk of the influenza (flu). It does not treat influenza. It is still possible to get influenza after receiving this vaccine, but the symptoms may be less severe or not last as long. It works by helping your immune system learn how to fight off a future infection. This medicine may be used for other purposes; ask your health care provider or pharmacist if you have questions. COMMON BRAND NAME(S): Afluria  Quadrivalent, FLUAD Quadrivalent, Fluarix Quadrivalent, Flublok Quadrivalent, FLUCELVAX Quadrivalent, Flulaval Quadrivalent, Fluzone Quadrivalent What should I tell my care team before I take this medication? They need to know if you have any of these conditions: Bleeding disorder like hemophilia Fever or infection Guillain-Barre syndrome or other neurological problems Immune system problems Infection with the human immunodeficiency virus (HIV) or AIDS Low blood platelet counts Multiple sclerosis An unusual or allergic reaction to influenza virus vaccine, latex, other medications, foods, dyes, or preservatives. Different brands of vaccines contain different allergens. Some may contain latex or eggs. Talk to your care team about your allergies to make sure that you get the right vaccine. Pregnant or trying to get pregnant Breastfeeding How should I use this medication? This vaccine is injected into a muscle or under the skin. It is given by your care team. A copy of Vaccine Information Statements will be given before each vaccination. Be sure to read this sheet carefully each time. This sheet may change often. Talk to your care team to see which vaccines are right for you. Some vaccines should not be used in all age groups. Overdosage: If you think you have taken too much of this medicine contact a poison control center or emergency room at once. NOTE: This medicine is only for you. Do not share this medicine with others. What if I miss a dose? This does not apply. What may interact with this medication? Certain medications that lower your immune system, such as etanercept, anakinra, infliximab, adalimumab Certain medications that prevent or treat blood clots, such as warfarin Chemotherapy or radiation therapy Phenytoin Steroid medications, such as prednisone or cortisone Theophylline Vaccines This list may not describe all possible interactions. Give your health care provider a list of  all the medicines, herbs, non-prescription drugs, or dietary supplements you use. Also tell them if you smoke, drink alcohol, or use illegal drugs. Some items may interact with your medicine. What should I watch for while using this medication? Report any side effects that do not go away with your care team. Call your care team if any unusual symptoms occur within 6 weeks of receiving this vaccine. You may still catch the flu, but the illness is not usually as bad. You cannot get the flu from the vaccine. The vaccine will not protect against colds or other illnesses that may cause fever. The vaccine is needed every year. What side effects may I notice from receiving this medication? Side effects that you should report to your care team as soon as possible: Allergic reactions--skin rash, itching, hives, swelling of the face, lips, tongue, or throat Side effects that usually do not require medical attention (report these to your care team if they continue or are bothersome): Chills Fatigue Headache Joint pain Loss of appetite Muscle pain Nausea Pain, redness, or irritation at injection site This list may not describe all possible side effects. Call your doctor for medical advice about side effects. You may report side effects to FDA at 1-800-FDA-1088. Where should I keep my medication? The vaccine is only  given by your care team. It will not be stored at home. NOTE: This sheet is a summary. It may not cover all possible information. If you have questions about this medicine, talk to your doctor, pharmacist, or health care provider.  2024 Elsevier/Gold Standard (2022-05-29 00:00:00)

## 2023-10-24 NOTE — Addendum Note (Signed)
Addended by: Gwinda Passe on: 10/24/2023 11:16 AM   Modules accepted: Orders

## 2023-10-25 LAB — CMP14+EGFR
ALT: 16 [IU]/L (ref 0–32)
AST: 18 [IU]/L (ref 0–40)
Albumin: 4.2 g/dL (ref 3.9–4.9)
Alkaline Phosphatase: 107 [IU]/L (ref 44–121)
BUN/Creatinine Ratio: 11 (ref 9–23)
BUN: 9 mg/dL (ref 6–20)
Bilirubin Total: 0.4 mg/dL (ref 0.0–1.2)
CO2: 23 mmol/L (ref 20–29)
Calcium: 9.4 mg/dL (ref 8.7–10.2)
Chloride: 101 mmol/L (ref 96–106)
Creatinine, Ser: 0.85 mg/dL (ref 0.57–1.00)
Globulin, Total: 3.3 g/dL (ref 1.5–4.5)
Glucose: 78 mg/dL (ref 70–99)
Potassium: 4.4 mmol/L (ref 3.5–5.2)
Sodium: 139 mmol/L (ref 134–144)
Total Protein: 7.5 g/dL (ref 6.0–8.5)
eGFR: 93 mL/min/{1.73_m2} (ref >59)

## 2023-10-25 LAB — CBC WITH DIFFERENTIAL/PLATELET
Basophils Absolute: 0 10*3/uL (ref 0.0–0.2)
Basos: 1 %
EOS (ABSOLUTE): 0.1 10*3/uL (ref 0.0–0.4)
Eos: 2 %
Hematocrit: 39.8 % (ref 34.0–46.6)
Hemoglobin: 13 g/dL (ref 11.1–15.9)
Immature Grans (Abs): 0 10*3/uL (ref 0.0–0.1)
Immature Granulocytes: 0 %
Lymphocytes Absolute: 1.4 10*3/uL (ref 0.7–3.1)
Lymphs: 23 %
MCH: 27 pg (ref 26.6–33.0)
MCHC: 32.7 g/dL (ref 31.5–35.7)
MCV: 83 fL (ref 79–97)
Monocytes Absolute: 0.5 10*3/uL (ref 0.1–0.9)
Monocytes: 8 %
Neutrophils Absolute: 4.1 10*3/uL (ref 1.4–7.0)
Neutrophils: 66 %
Platelets: 488 10*3/uL — ABNORMAL HIGH (ref 150–450)
RBC: 4.81 x10E6/uL (ref 3.77–5.28)
RDW: 15.6 % — ABNORMAL HIGH (ref 11.7–15.4)
WBC: 6.1 10*3/uL (ref 3.4–10.8)

## 2023-10-25 LAB — VITAMIN D 25 HYDROXY (VIT D DEFICIENCY, FRACTURES): Vit D, 25-Hydroxy: 11.9 ng/mL — ABNORMAL LOW (ref 30.0–100.0)

## 2023-10-25 LAB — LIPID PANEL
Chol/HDL Ratio: 2.9 ratio (ref 0.0–4.4)
Cholesterol, Total: 203 mg/dL — ABNORMAL HIGH (ref 100–199)
HDL: 69 mg/dL (ref >39)
LDL Chol Calc (NIH): 113 mg/dL — ABNORMAL HIGH (ref 0–99)
Triglycerides: 123 mg/dL (ref 0–149)
VLDL Cholesterol Cal: 21 mg/dL (ref 5–40)

## 2023-10-25 LAB — HEMOGLOBIN A1C
Est. average glucose Bld gHb Est-mCnc: 123 mg/dL
Hgb A1c MFr Bld: 5.9 % — ABNORMAL HIGH (ref 4.8–5.6)

## 2023-10-25 LAB — TSH+FREE T4
Free T4: 1.11 ng/dL (ref 0.82–1.77)
TSH: 0.601 u[IU]/mL (ref 0.450–4.500)

## 2023-10-28 ENCOUNTER — Other Ambulatory Visit (INDEPENDENT_AMBULATORY_CARE_PROVIDER_SITE_OTHER): Payer: Self-pay | Admitting: Primary Care

## 2023-10-28 DIAGNOSIS — J029 Acute pharyngitis, unspecified: Secondary | ICD-10-CM

## 2023-10-28 DIAGNOSIS — J45909 Unspecified asthma, uncomplicated: Secondary | ICD-10-CM

## 2023-10-28 DIAGNOSIS — Z3041 Encounter for surveillance of contraceptive pills: Secondary | ICD-10-CM

## 2023-10-28 DIAGNOSIS — I10 Essential (primary) hypertension: Secondary | ICD-10-CM

## 2023-10-28 DIAGNOSIS — Z760 Encounter for issue of repeat prescription: Secondary | ICD-10-CM

## 2023-10-28 NOTE — Telephone Encounter (Signed)
Copied from CRM [id number]. Topic: General - Other >> Oct 28, 2023 12:48 PM Everette C wrote: Reason for CRM: Medication Refill - Medication: albuterol (VENTOLIN HFA) 108 (90 Base) MCG/ACT inhaler [[id number]]  amLODipine (NORVASC) 10 MG tablet [[id number]]  hydrochlorothiazide (HYDRODIURIL) 25 MG tablet [[id number]]  lidocaine (XYLOCAINE) 2 % solution [[id number]]  norgestimate-ethinyl estradiol (SPRINTEC 28) 0.25-35 MG-MCG tablet [[id number]]  Has the patient contacted their pharmacy? Yes.   (Agent: If no, request that the patient contact the pharmacy for the refill. If patient does not wish to contact the pharmacy document the reason why and proceed with request.) (Agent: If yes, when and what did the pharmacy advise?)  Preferred Pharmacy (with phone number or street name): [id number] - [place], [place] - [address] AT [facility] [address] [place] [address] Phone: [phone] Fax: [phone] Hours: Open 24 hours   Has the patient been seen for an appointment in the last year OR does the patient have an upcoming appointment? Yes.    Agent: Please be advised that RX refills may take up to 3 business days. We ask that you follow-up with your pharmacy.

## 2023-10-29 MED ORDER — ALBUTEROL SULFATE HFA 108 (90 BASE) MCG/ACT IN AERS: 2.0000 | INHALATION_SPRAY | RESPIRATORY_TRACT | 1 refills | Status: DC | PRN

## 2023-10-29 MED ORDER — HYDROCHLOROTHIAZIDE 25 MG PO TABS: 25.0000 mg | ORAL_TABLET | Freq: Every day | ORAL | 1 refills | Status: DC

## 2023-10-29 MED ORDER — NORGESTIMATE-ETH ESTRADIOL 0.25-35 MG-MCG PO TABS: 1.0000 | ORAL_TABLET | Freq: Every day | ORAL | 11 refills | Status: DC

## 2023-10-29 MED ORDER — AMLODIPINE BESYLATE 10 MG PO TABS: 10.0000 mg | ORAL_TABLET | Freq: Every day | ORAL | 1 refills | Status: DC

## 2023-10-29 NOTE — Telephone Encounter (Signed)
Requested medications are due for refill today.  yes  Requested medications are on the active medications list.  yes  Last refill. 03/28/2023   Future visit scheduled.   yes  Notes to clinic.  Medication not assigned to a protocol. Please review for refill.    Requested Prescriptions  Pending Prescriptions Disp Refills   lidocaine (XYLOCAINE) 2 % solution 100 mL 0    Sig: Use as directed 15 mLs in the mouth or throat as needed for mouth pain.     Off-Protocol Failed - 10/28/2023  2:41 PM      Failed - Medication not assigned to a protocol, review manually.      Passed - Valid encounter within last 12 months    Recent Outpatient Visits           5 days ago Annual physical exam   [place] [facility] Grayce Sessions, NP   9 months ago Asthma, unspecified asthma severity, unspecified whether complicated, unspecified whether persistent   [place] [facility] Grayce Sessions, NP       Future Appointments             In 2 weeks Grayce Sessions, NP [place] [facility]            Signed Prescriptions Disp Refills   albuterol (VENTOLIN HFA) 108 (90 Base) MCG/ACT inhaler 18 each 1    Sig: Inhale 2 puffs into the lungs every 4 (four) hours as needed for wheezing or shortness of breath.     Pulmonology:  Beta Agonists 2 Failed - 10/28/2023  2:41 PM      Failed - Last BP in normal range    BP Readings from Last 1 Encounters:  10/24/23 (!) 143/93         Passed - Last Heart Rate in normal range    Pulse Readings from Last 1 Encounters:  10/24/23 90         Passed - Valid encounter within last 12 months    Recent Outpatient Visits           5 days ago Annual physical exam   [place] [facility] Grayce Sessions, NP   9 months ago Asthma, unspecified asthma severity, unspecified whether complicated, unspecified whether persistent   [place] [facility]  Grayce Sessions, NP       Future Appointments             In 2 weeks Grayce Sessions, NP  [facility]             amLODipine (NORVASC) 10 MG tablet 90 tablet 1    Sig: Take 1 tablet (10 mg total) by mouth daily. Please schedule an appointment for further refills.     Cardiovascular: Calcium Channel Blockers 2 Failed - 10/28/2023  2:41 PM      Failed - Last BP in normal range    BP Readings from Last 1 Encounters:  10/24/23 (!) 143/93         Passed - Last Heart Rate in normal range    Pulse Readings from Last 1 Encounters:  10/24/23 90         Passed - Valid encounter within last 6 months    Recent Outpatient Visits           5 days ago Annual physical exam    [facility] Grayce Sessions, NP  9 months ago Asthma, unspecified asthma severity, unspecified whether complicated, unspecified whether persistent   [place] [facility] Grayce Sessions, NP       Future Appointments             In 2 weeks Randa Evens, Kinnie Scales, NP [place] [facility]             hydrochlorothiazide (HYDRODIURIL) 25 MG tablet 90 tablet 1    Sig: Take 1 tablet (25 mg total) by mouth daily.     Cardiovascular: Diuretics - Thiazide Failed - 10/28/2023  2:41 PM      Failed - Last BP in normal range    BP Readings from Last 1 Encounters:  10/24/23 (!) 143/93         Passed - Cr in normal range and within 180 days    Creat  Date Value Ref Range Status  09/26/2015 0.88 0.50 - 1.10 mg/dL Final   Creatinine, Ser  Date Value Ref Range Status  10/24/2023 0.85 0.57 - 1.00 mg/dL Final         Passed - K in normal range and within 180 days    Potassium  Date Value Ref Range Status  10/24/2023 4.4 3.5 - 5.2 mmol/L Final         Passed - Na in normal range and within 180 days    Sodium  Date Value Ref Range Status  10/24/2023 139 134 - 144 mmol/L Final         Passed -  Valid encounter within last 6 months    Recent Outpatient Visits           5 days ago Annual physical exam   [place] [facility] Grayce Sessions, NP   9 months ago Asthma, unspecified asthma severity, unspecified whether complicated, unspecified whether persistent   [place] [facility] Grayce Sessions, NP       Future Appointments             In 2 weeks Randa Evens Kinnie Scales, NP [place] [facility]             norgestimate-ethinyl estradiol (SPRINTEC 28) 0.25-35 MG-MCG tablet 28 tablet 11    Sig: Take 1 tablet by mouth daily.     OB/GYN:  Contraceptives Failed - 10/28/2023  2:41 PM      Failed - Last BP in normal range    BP Readings from Last 1 Encounters:  10/24/23 (!) 143/93         Passed - Valid encounter within last 12 months    Recent Outpatient Visits           5 days ago Annual physical exam   [place] [facility] Grayce Sessions, NP   9 months ago Asthma, unspecified asthma severity, unspecified whether complicated, unspecified whether persistent   [place] [facility] Grayce Sessions, NP       Future Appointments             In 2 weeks Grayce Sessions, NP  [facility]            Passed - Patient is not a smoker

## 2023-10-29 NOTE — Telephone Encounter (Signed)
Requested Prescriptions  Pending Prescriptions Disp Refills   albuterol (VENTOLIN HFA) 108 (90 Base) MCG/ACT inhaler 18 each 1    Sig: Inhale 2 puffs into the lungs every 4 (four) hours as needed for wheezing or shortness of breath.     Pulmonology:  Beta Agonists 2 Failed - 10/28/2023  2:41 PM      Failed - Last BP in normal range    BP Readings from Last 1 Encounters:  10/24/23 (!) 143/93         Passed - Last Heart Rate in normal range    Pulse Readings from Last 1 Encounters:  10/24/23 90         Passed - Valid encounter within last 12 months    Recent Outpatient Visits           5 days ago Annual physical exam   [place] [facility] Grayce Sessions, NP   9 months ago Asthma, unspecified asthma severity, unspecified whether complicated, unspecified whether persistent   [place] [facility] Grayce Sessions, NP       Future Appointments             In 2 weeks Grayce Sessions, NP [place] [facility]             amLODipine (NORVASC) 10 MG tablet 90 tablet 1    Sig: Take 1 tablet (10 mg total) by mouth daily. Please schedule an appointment for further refills.     Cardiovascular: Calcium Channel Blockers 2 Failed - 10/28/2023  2:41 PM      Failed - Last BP in normal range    BP Readings from Last 1 Encounters:  10/24/23 (!) 143/93         Passed - Last Heart Rate in normal range    Pulse Readings from Last 1 Encounters:  10/24/23 90         Passed - Valid encounter within last 6 months    Recent Outpatient Visits           5 days ago Annual physical exam   [place] [facility] Grayce Sessions, NP   9 months ago Asthma, unspecified asthma severity, unspecified whether complicated, unspecified whether persistent   [place] [facility] Grayce Sessions, NP       Future Appointments             In 2 weeks Grayce Sessions, NP   [facility]             hydrochlorothiazide (HYDRODIURIL) 25 MG tablet 90 tablet 1    Sig: Take 1 tablet (25 mg total) by mouth daily.     Cardiovascular: Diuretics - Thiazide Failed - 10/28/2023  2:41 PM      Failed - Last BP in normal range    BP Readings from Last 1 Encounters:  10/24/23 (!) 143/93         Passed - Cr in normal range and within 180 days    Creat  Date Value Ref Range Status  09/26/2015 0.88 0.50 - 1.10 mg/dL Final   Creatinine, Ser  Date Value Ref Range Status  10/24/2023 0.85 0.57 - 1.00 mg/dL Final         Passed - K in normal range and within 180 days    Potassium  Date Value Ref Range Status  10/24/2023 4.4 3.5 - 5.2 mmol/L Final  Passed - Na in normal range and within 180 days    Sodium  Date Value Ref Range Status  10/24/2023 139 134 - 144 mmol/L Final         Passed - Valid encounter within last 6 months    Recent Outpatient Visits           5 days ago Annual physical exam   [place] [facility] Grayce Sessions, NP   9 months ago Asthma, unspecified asthma severity, unspecified whether complicated, unspecified whether persistent   [place] [facility] Grayce Sessions, NP       Future Appointments             In 2 weeks Grayce Sessions, NP [place] [facility]             lidocaine (XYLOCAINE) 2 % solution 100 mL 0    Sig: Use as directed 15 mLs in the mouth or throat as needed for mouth pain.     Off-Protocol Failed - 10/28/2023  2:41 PM      Failed - Medication not assigned to a protocol, review manually.      Passed - Valid encounter within last 12 months    Recent Outpatient Visits           5 days ago Annual physical exam   [place] [facility] Grayce Sessions, NP   9 months ago Asthma, unspecified asthma severity, unspecified whether complicated, unspecified whether persistent   [place]  [facility] Grayce Sessions, NP       Future Appointments             In 2 weeks Randa Evens Kinnie Scales, NP [place] [facility]             norgestimate-ethinyl estradiol (SPRINTEC 28) 0.25-35 MG-MCG tablet 28 tablet 11    Sig: Take 1 tablet by mouth daily.     OB/GYN:  Contraceptives Failed - 10/28/2023  2:41 PM      Failed - Last BP in normal range    BP Readings from Last 1 Encounters:  10/24/23 (!) 143/93         Passed - Valid encounter within last 12 months    Recent Outpatient Visits           5 days ago Annual physical exam   [place] [facility] Grayce Sessions, NP   9 months ago Asthma, unspecified asthma severity, unspecified whether complicated, unspecified whether persistent   [place] [facility] Grayce Sessions, NP       Future Appointments             In 2 weeks Grayce Sessions, NP [place] [facility]            Passed - Patient is not a smoker

## 2023-11-04 NOTE — Telephone Encounter (Signed)
Will forward to provider  

## 2023-11-10 MED ORDER — LIDOCAINE VISCOUS HCL 2 % MT SOLN: 15.0000 mL | OROMUCOSAL | 0 refills | Status: DC | PRN

## 2023-11-15 ENCOUNTER — Other Ambulatory Visit (HOSPITAL_COMMUNITY): Payer: Self-pay

## 2023-11-15 ENCOUNTER — Ambulatory Visit (INDEPENDENT_AMBULATORY_CARE_PROVIDER_SITE_OTHER): Payer: 59

## 2023-11-15 VITALS — BP 130/85

## 2023-11-15 DIAGNOSIS — Z0130 Encounter for examination of blood pressure without abnormal findings: Secondary | ICD-10-CM

## 2023-11-15 MED ORDER — WEGOVY 0.25 MG/0.5ML ~~LOC~~ SOAJ
0.2500 mg | SUBCUTANEOUS | 0 refills | Status: DC
  Filled 2023-11-15 – 2023-11-27 (×12): qty 2, 28d supply, fill #0

## 2023-11-15 NOTE — Progress Notes (Signed)
   Blood Pressure Recheck Visit  Name: [name] MRN: [id number] Date of Birth: 05-22-1991  Brock Bad Tool presents today for Blood Pressure recheck with clinical support staff.    BP Readings from Last 3 Encounters:  11/15/23 130/85  10/24/23 (!) 143/93  01/10/23 (!) 149/91    Current Outpatient Medications  Medication Sig Dispense Refill   albuterol (VENTOLIN HFA) 108 (90 Base) MCG/ACT inhaler Inhale 2 puffs into the lungs every 4 (four) hours as needed for wheezing or shortness of breath. 18 each 1   amLODipine (NORVASC) 10 MG tablet Take 1 tablet (10 mg total) by mouth daily. Please schedule an appointment for further refills. 90 tablet 1   hydrochlorothiazide (HYDRODIURIL) 25 MG tablet Take 1 tablet (25 mg total) by mouth daily. 90 tablet 1   lidocaine (XYLOCAINE) 2 % solution Use as directed 15 mLs in the mouth or throat as needed for mouth pain. 100 mL 0   norgestimate-ethinyl estradiol (SPRINTEC 28) 0.25-35 MG-MCG tablet Take 1 tablet by mouth daily. 28 tablet 11   No current facility-administered medications for this visit.    Hypertensive Medication Review: Patient states that they are taking all their hypertensive medications as prescribed and their last dose of hypertensive medications was this morning around 67m  Documentation of any medication adherence discrepancies: none  Provider Recommendation:  Spoke to [place] and they stated: Bp is better. Continue medication and will see her in 3 months from last visit    Patient has been given provider's recommendations and does not have any questions or concerns at this time. Patient will contact the office for any future questions or concerns.

## 2023-11-18 ENCOUNTER — Encounter (HOSPITAL_COMMUNITY): Payer: Self-pay

## 2023-11-18 ENCOUNTER — Other Ambulatory Visit: Payer: Self-pay

## 2023-11-18 ENCOUNTER — Other Ambulatory Visit (HOSPITAL_COMMUNITY): Payer: Self-pay

## 2023-11-19 ENCOUNTER — Other Ambulatory Visit: Payer: Self-pay

## 2023-11-19 ENCOUNTER — Other Ambulatory Visit (HOSPITAL_COMMUNITY): Payer: Self-pay

## 2023-11-20 ENCOUNTER — Other Ambulatory Visit (HOSPITAL_COMMUNITY): Payer: Self-pay

## 2023-11-27 ENCOUNTER — Other Ambulatory Visit (HOSPITAL_COMMUNITY): Payer: Self-pay

## 2023-11-27 ENCOUNTER — Other Ambulatory Visit (HOSPITAL_BASED_OUTPATIENT_CLINIC_OR_DEPARTMENT_OTHER): Payer: Self-pay

## 2023-11-29 ENCOUNTER — Other Ambulatory Visit (HOSPITAL_BASED_OUTPATIENT_CLINIC_OR_DEPARTMENT_OTHER): Payer: Self-pay

## 2023-12-02 ENCOUNTER — Other Ambulatory Visit (HOSPITAL_BASED_OUTPATIENT_CLINIC_OR_DEPARTMENT_OTHER): Payer: Self-pay

## 2023-12-03 ENCOUNTER — Other Ambulatory Visit (HOSPITAL_BASED_OUTPATIENT_CLINIC_OR_DEPARTMENT_OTHER): Payer: Self-pay

## 2023-12-04 ENCOUNTER — Other Ambulatory Visit (HOSPITAL_BASED_OUTPATIENT_CLINIC_OR_DEPARTMENT_OTHER): Payer: Self-pay

## 2023-12-05 ENCOUNTER — Other Ambulatory Visit (HOSPITAL_BASED_OUTPATIENT_CLINIC_OR_DEPARTMENT_OTHER): Payer: Self-pay

## 2023-12-09 ENCOUNTER — Other Ambulatory Visit (HOSPITAL_BASED_OUTPATIENT_CLINIC_OR_DEPARTMENT_OTHER): Payer: Self-pay

## 2023-12-10 ENCOUNTER — Other Ambulatory Visit (HOSPITAL_BASED_OUTPATIENT_CLINIC_OR_DEPARTMENT_OTHER): Payer: Self-pay

## 2023-12-12 ENCOUNTER — Other Ambulatory Visit (HOSPITAL_BASED_OUTPATIENT_CLINIC_OR_DEPARTMENT_OTHER): Payer: Self-pay

## 2023-12-16 ENCOUNTER — Other Ambulatory Visit (HOSPITAL_BASED_OUTPATIENT_CLINIC_OR_DEPARTMENT_OTHER): Payer: Self-pay

## 2023-12-17 ENCOUNTER — Other Ambulatory Visit (HOSPITAL_BASED_OUTPATIENT_CLINIC_OR_DEPARTMENT_OTHER): Payer: Self-pay

## 2023-12-19 ENCOUNTER — Other Ambulatory Visit (HOSPITAL_BASED_OUTPATIENT_CLINIC_OR_DEPARTMENT_OTHER): Payer: Self-pay

## 2023-12-20 ENCOUNTER — Other Ambulatory Visit (HOSPITAL_BASED_OUTPATIENT_CLINIC_OR_DEPARTMENT_OTHER): Payer: Self-pay

## 2023-12-24 ENCOUNTER — Other Ambulatory Visit (HOSPITAL_BASED_OUTPATIENT_CLINIC_OR_DEPARTMENT_OTHER): Payer: Self-pay

## 2023-12-26 ENCOUNTER — Other Ambulatory Visit (HOSPITAL_BASED_OUTPATIENT_CLINIC_OR_DEPARTMENT_OTHER): Payer: Self-pay

## 2023-12-27 ENCOUNTER — Other Ambulatory Visit (HOSPITAL_BASED_OUTPATIENT_CLINIC_OR_DEPARTMENT_OTHER): Payer: Self-pay

## 2023-12-30 ENCOUNTER — Other Ambulatory Visit (HOSPITAL_BASED_OUTPATIENT_CLINIC_OR_DEPARTMENT_OTHER): Payer: Self-pay

## 2023-12-30 ENCOUNTER — Other Ambulatory Visit (INDEPENDENT_AMBULATORY_CARE_PROVIDER_SITE_OTHER): Payer: Self-pay | Admitting: Primary Care

## 2023-12-30 DIAGNOSIS — J45909 Unspecified asthma, uncomplicated: Secondary | ICD-10-CM

## 2023-12-31 ENCOUNTER — Other Ambulatory Visit (HOSPITAL_BASED_OUTPATIENT_CLINIC_OR_DEPARTMENT_OTHER): Payer: Self-pay

## 2024-01-02 ENCOUNTER — Other Ambulatory Visit (HOSPITAL_BASED_OUTPATIENT_CLINIC_OR_DEPARTMENT_OTHER): Payer: Self-pay

## 2024-01-03 ENCOUNTER — Telehealth: Payer: 59 | Admitting: Family Medicine

## 2024-01-03 ENCOUNTER — Other Ambulatory Visit (HOSPITAL_BASED_OUTPATIENT_CLINIC_OR_DEPARTMENT_OTHER): Payer: Self-pay

## 2024-01-03 DIAGNOSIS — K047 Periapical abscess without sinus: Secondary | ICD-10-CM

## 2024-01-03 MED ORDER — CLINDAMYCIN HCL 300 MG PO CAPS: 300.0000 mg | ORAL_CAPSULE | Freq: Three times a day (TID) | ORAL | 0 refills | Status: AC

## 2024-01-03 NOTE — Progress Notes (Signed)

## 2024-01-06 ENCOUNTER — Other Ambulatory Visit (HOSPITAL_BASED_OUTPATIENT_CLINIC_OR_DEPARTMENT_OTHER): Payer: Self-pay

## 2024-01-07 ENCOUNTER — Other Ambulatory Visit (HOSPITAL_BASED_OUTPATIENT_CLINIC_OR_DEPARTMENT_OTHER): Payer: Self-pay

## 2024-01-08 ENCOUNTER — Other Ambulatory Visit (HOSPITAL_BASED_OUTPATIENT_CLINIC_OR_DEPARTMENT_OTHER): Payer: Self-pay

## 2024-06-30 ENCOUNTER — Telehealth (INDEPENDENT_AMBULATORY_CARE_PROVIDER_SITE_OTHER): Payer: Self-pay | Admitting: Primary Care

## 2024-06-30 NOTE — Telephone Encounter (Signed)
Returned pt call and lvm  ?

## 2024-06-30 NOTE — Telephone Encounter (Signed)
 Copied from CRM [id number]. Topic: Clinical - Medical Advice >> Jun 30, 2024  9:43 AM Tobias CROME wrote: Reason for CRM: Patient inquiring if she can switch birth control at physical appointment on 11/02/24 or if it has to be be done at a separate visit. Pt currently on birth control pill but would like to switch to nexplanon .   Patient can be reached at via [place] or phone number.

## 2024-07-13 DIAGNOSIS — J45909 Unspecified asthma, uncomplicated: Secondary | ICD-10-CM | POA: Diagnosis not present

## 2024-07-13 DIAGNOSIS — E785 Hyperlipidemia, unspecified: Secondary | ICD-10-CM | POA: Diagnosis not present

## 2024-07-13 DIAGNOSIS — Z6841 Body Mass Index (BMI) 40.0 and over, adult: Secondary | ICD-10-CM | POA: Diagnosis not present

## 2024-07-13 DIAGNOSIS — I10 Essential (primary) hypertension: Secondary | ICD-10-CM | POA: Diagnosis not present

## 2024-07-17 ENCOUNTER — Telehealth (INDEPENDENT_AMBULATORY_CARE_PROVIDER_SITE_OTHER): Payer: Self-pay | Admitting: Primary Care

## 2024-07-17 NOTE — Telephone Encounter (Signed)
 Called pt to confirm appt. Pt did not answer and LVM

## 2024-07-20 ENCOUNTER — Ambulatory Visit (INDEPENDENT_AMBULATORY_CARE_PROVIDER_SITE_OTHER): Admitting: Primary Care

## 2024-08-02 DIAGNOSIS — Z6841 Body Mass Index (BMI) 40.0 and over, adult: Secondary | ICD-10-CM | POA: Diagnosis not present

## 2024-08-02 DIAGNOSIS — E785 Hyperlipidemia, unspecified: Secondary | ICD-10-CM | POA: Diagnosis not present

## 2024-08-02 DIAGNOSIS — I10 Essential (primary) hypertension: Secondary | ICD-10-CM | POA: Diagnosis not present

## 2024-09-01 ENCOUNTER — Other Ambulatory Visit (INDEPENDENT_AMBULATORY_CARE_PROVIDER_SITE_OTHER): Payer: Self-pay | Admitting: Primary Care

## 2024-09-01 DIAGNOSIS — J45909 Unspecified asthma, uncomplicated: Secondary | ICD-10-CM

## 2024-09-01 NOTE — Telephone Encounter (Unsigned)
 Copied from CRM #[id number]. Topic: Clinical - Medication Refill >> Sep 01, 2024  1:17 PM Everette C wrote: Medication: albuterol  (VENTOLIN  HFA) 108 (90 Base) MCG/ACT inhaler   Has the patient contacted their pharmacy? Yes (Agent: If no, request that the patient contact the pharmacy for the refill. If patient does not wish to contact the pharmacy document the reason why and proceed with request.) (Agent: If yes, when and what did the pharmacy advise?)  This is the patient's preferred pharmacy:  [id number] - [place],  - [address] AT [facility] OF [address] [[id number]]  Is this the correct pharmacy for this prescription? Yes If no, delete pharmacy and type the correct one.   Has the prescription been filled recently? Yes  Is the patient out of the medication? Yes  Has the patient been seen for an appointment in the last year OR does the patient have an upcoming appointment? Yes  Can we respond through MyChart? No  Agent: Please be advised that Rx refills may take up to 3 business days. We ask that you follow-up with your pharmacy.

## 2024-09-02 MED ORDER — ALBUTEROL SULFATE HFA 108 (90 BASE) MCG/ACT IN AERS: 2.0000 | INHALATION_SPRAY | RESPIRATORY_TRACT | 1 refills | Status: DC | PRN

## 2024-09-02 NOTE — Telephone Encounter (Signed)
 Requested Prescriptions  Pending Prescriptions Disp Refills   albuterol  (VENTOLIN  HFA) 108 (90 Base) MCG/ACT inhaler 8.5 each 1    Sig: Inhale 2 puffs into the lungs every 4 (four) hours as needed for wheezing or shortness of breath.     Pulmonology:  Beta Agonists 2 Passed - 09/02/2024 11:30 AM      Passed - Last BP in normal range    BP Readings from Last 1 Encounters:  11/15/23 130/85         Passed - Last Heart Rate in normal range    Pulse Readings from Last 1 Encounters:  10/24/23 90         Passed - Valid encounter within last 12 months    Recent Outpatient Visits           10 months ago Annual physical exam   [place] [facility] Stephanie Rosaline SQUIBB, NP   1 year ago Asthma, unspecified asthma severity, unspecified whether complicated, unspecified whether persistent   [place] [facility] Stephanie Rosaline SQUIBB, NP

## 2024-10-05 DIAGNOSIS — Z6841 Body Mass Index (BMI) 40.0 and over, adult: Secondary | ICD-10-CM | POA: Diagnosis not present

## 2024-10-05 DIAGNOSIS — R7303 Prediabetes: Secondary | ICD-10-CM | POA: Diagnosis not present

## 2024-10-05 DIAGNOSIS — E66812 Obesity, class 2: Secondary | ICD-10-CM | POA: Diagnosis not present

## 2024-10-05 DIAGNOSIS — M25562 Pain in left knee: Secondary | ICD-10-CM | POA: Diagnosis not present

## 2024-10-05 DIAGNOSIS — M25561 Pain in right knee: Secondary | ICD-10-CM | POA: Diagnosis not present

## 2024-10-05 DIAGNOSIS — I119 Hypertensive heart disease without heart failure: Secondary | ICD-10-CM | POA: Diagnosis not present

## 2024-10-05 DIAGNOSIS — E785 Hyperlipidemia, unspecified: Secondary | ICD-10-CM | POA: Diagnosis not present

## 2024-11-02 ENCOUNTER — Ambulatory Visit (INDEPENDENT_AMBULATORY_CARE_PROVIDER_SITE_OTHER): Admitting: Primary Care

## 2024-11-02 VITALS — BP 138/92 | HR 100 | Resp 16 | Ht 64.0 in | Wt 260.0 lb

## 2024-11-02 DIAGNOSIS — R7303 Prediabetes: Secondary | ICD-10-CM

## 2024-11-02 DIAGNOSIS — Z760 Encounter for issue of repeat prescription: Secondary | ICD-10-CM

## 2024-11-02 DIAGNOSIS — J4520 Mild intermittent asthma, uncomplicated: Secondary | ICD-10-CM | POA: Diagnosis not present

## 2024-11-02 DIAGNOSIS — Z23 Encounter for immunization: Secondary | ICD-10-CM

## 2024-11-02 DIAGNOSIS — Z3041 Encounter for surveillance of contraceptive pills: Secondary | ICD-10-CM

## 2024-11-02 DIAGNOSIS — E559 Vitamin D deficiency, unspecified: Secondary | ICD-10-CM | POA: Diagnosis not present

## 2024-11-02 DIAGNOSIS — E782 Mixed hyperlipidemia: Secondary | ICD-10-CM | POA: Diagnosis not present

## 2024-11-02 DIAGNOSIS — I10 Essential (primary) hypertension: Secondary | ICD-10-CM | POA: Diagnosis not present

## 2024-11-02 DIAGNOSIS — Z5320 Procedure and treatment not carried out because of patient's decision for unspecified reasons: Secondary | ICD-10-CM

## 2024-11-02 MED ORDER — AMLODIPINE BESYLATE 10 MG PO TABS: 10.0000 mg | ORAL_TABLET | Freq: Every day | ORAL | 1 refills | Status: AC

## 2024-11-02 MED ORDER — NORGESTIMATE-ETH ESTRADIOL 0.25-35 MG-MCG PO TABS: 1.0000 | ORAL_TABLET | Freq: Every day | ORAL | 11 refills | Status: AC

## 2024-11-02 MED ORDER — HYDROCHLOROTHIAZIDE 25 MG PO TABS: 25.0000 mg | ORAL_TABLET | Freq: Every day | ORAL | 1 refills | Status: AC

## 2024-11-02 NOTE — Patient Instructions (Addendum)
 Hypertension, Adult High blood pressure (hypertension) is when the force of blood pumping through the arteries is too strong. The arteries are the blood vessels that carry blood from the heart throughout the body. Hypertension forces the heart to work harder to pump blood and may cause arteries to become narrow or stiff. Untreated or uncontrolled hypertension can lead to a heart attack, heart failure, a stroke, kidney disease, and other problems. A blood pressure reading consists of a higher number over a lower number. Ideally, your blood pressure should be below 120/80. The first (top) number is called the systolic pressure. It is a measure of the pressure in your arteries as your heart beats. The second (bottom) number is called the diastolic pressure. It is a measure of the pressure in your arteries as the heart relaxes. What are the causes? The exact cause of this condition is not known. There are some conditions that result in high blood pressure. What increases the risk? Certain factors may make you more likely to develop high blood pressure. Some of these risk factors are under your control, including: Smoking. Not getting enough exercise or physical activity. Being overweight. Having too much fat, sugar, calories, or salt (sodium) in your diet. Drinking too much alcohol. Other risk factors include: Having a personal history of heart disease, diabetes, high cholesterol, or kidney disease. Stress. Having a family history of high blood pressure and high cholesterol. Having obstructive sleep apnea. Age. The risk increases with age. What are the signs or symptoms? High blood pressure may not cause symptoms. Very high blood pressure (hypertensive crisis) may cause: Headache. Fast or irregular heartbeats (palpitations). Shortness of breath. Nosebleed. Nausea and vomiting. Vision changes. Severe chest pain, dizziness, and seizures. How is this diagnosed? This condition is diagnosed by  measuring your blood pressure while you are seated, with your arm resting on a flat surface, your legs uncrossed, and your feet flat on the floor. The cuff of the blood pressure monitor will be placed directly against the skin of your upper arm at the level of your heart. Blood pressure should be measured at least twice using the same arm. Certain conditions can cause a difference in blood pressure between your right and left arms. If you have a high blood pressure reading during one visit or you have normal blood pressure with other risk factors, you may be asked to: Return on a different day to have your blood pressure checked again. Monitor your blood pressure at home for 1 week or longer. If you are diagnosed with hypertension, you may have other blood or imaging tests to help your health care provider understand your overall risk for other conditions. How is this treated? This condition is treated by making healthy lifestyle changes, such as eating healthy foods, exercising more, and reducing your alcohol intake. You may be referred for counseling on a healthy diet and physical activity. Your health care provider may prescribe medicine if lifestyle changes are not enough to get your blood pressure under control and if: Your systolic blood pressure is above 130. Your diastolic blood pressure is above 80. Your personal target blood pressure may vary depending on your medical conditions, your age, and other factors. Follow these instructions at home: Eating and drinking  Eat a diet that is high in fiber and potassium, and low in sodium, added sugar, and fat. An example of this eating plan is called the DASH diet. DASH stands for Dietary Approaches to Stop Hypertension. To eat this way: Eat  plenty of fresh fruits and vegetables. Try to fill one half of your plate at each meal with fruits and vegetables. Eat whole grains, such as whole-wheat pasta, brown rice, or whole-grain bread. Fill about one  fourth of your plate with whole grains. Eat or drink low-fat dairy products, such as skim milk or low-fat yogurt. Avoid fatty cuts of meat, processed or cured meats, and poultry with skin. Fill about one fourth of your plate with lean proteins, such as fish, chicken without skin, beans, eggs, or tofu. Avoid pre-made and processed foods. These tend to be higher in sodium, added sugar, and fat. Reduce your daily sodium intake. Many people with hypertension should eat less than 1,500 mg of sodium a day. Do not drink alcohol if: Your health care provider tells you not to drink. You are pregnant, may be pregnant, or are planning to become pregnant. If you drink alcohol: Limit how much you have to: 0-1 drink a day for women. 0-2 drinks a day for men. Know how much alcohol is in your drink. In the [place] one drink equals one 12 oz bottle of beer (355 mL), one 5 oz glass of wine (148 mL), or one 1 oz glass of hard liquor (44 mL). Lifestyle  Work with your health care provider to maintain a healthy body weight or to lose weight. Ask what an ideal weight is for you. Get at least 30 minutes of exercise that causes your heart to beat faster (aerobic exercise) most days of the week. Activities may include walking, swimming, or biking. Include exercise to strengthen your muscles (resistance exercise), such as Pilates or lifting weights, as part of your weekly exercise routine. Try to do these types of exercises for 30 minutes at least 3 days a week. Do not use any products that contain nicotine or tobacco. These products include cigarettes, chewing tobacco, and vaping devices, such as e-cigarettes. If you need help quitting, ask your health care provider. Monitor your blood pressure at home as told by your health care provider. Keep all follow-up visits. This is important. Medicines Take over-the-counter and prescription medicines only as told by your health care provider. Follow directions carefully. Blood  pressure medicines must be taken as prescribed. Do not skip doses of blood pressure medicine. Doing this puts you at risk for problems and can make the medicine less effective. Ask your health care provider about side effects or reactions to medicines that you should watch for. Contact a health care provider if you: Think you are having a reaction to a medicine you are taking. Have headaches that keep coming back (recurring). Feel dizzy. Have swelling in your ankles. Have trouble with your vision. Get help right away if you: Develop a severe headache or confusion. Have unusual weakness or numbness. Feel faint. Have severe pain in your chest or abdomen. Vomit repeatedly. Have trouble breathing. These symptoms may be an emergency. Get help right away. Call 911. Do not wait to see if the symptoms will go away. Do not drive yourself to the hospital. Summary Hypertension is when the force of blood pumping through your arteries is too strong. If this condition is not controlled, it may put you at risk for serious complications. Your personal target blood pressure may vary depending on your medical conditions, your age, and other factors. For most people, a normal blood pressure is less than 120/80. Hypertension is treated with lifestyle changes, medicines, or a combination of both. Lifestyle changes include losing weight, eating a healthy,  low-sodium diet, exercising more, and limiting alcohol. This information is not intended to replace advice given to you by your health care provider. Make sure you discuss any questions you have with your health care provider. Document Revised: 10/24/2021 Document Reviewed: 10/24/2021 Elsevier Patient Education  2024 Elsevier Inc.  HPV Vaccine Information for Parents: What to Know Human papillomavirus (HPV) is a common virus. It is contagious, which means that it spreads from person to person through skin-to-skin or sexual contact. There are many types of  HPV viruses. Genital or mucosal HPV can cause warts in the genitals Cutaneous or nonmucosal HPV can cause warts on the hands or feet. Some genital HPV types may cause cancer. Your child can get a shot, also called a vaccine, to help prevent the HPV types that can cause cancer, genital warts, or warts near the opening of the butt (anus). the shot is safe and effective. Getting the vaccine before your child is sexually active gives them the best protection from HPV through adulthood. What actions can I take to lower my child's risk for HPV? Have your child get the HPV vaccine before they become sexually active. The best time to get the shot is at around 16-36 years old. The vaccine may be given to children as young as 7 years old. If your child gets the vaccine before they are 58 years old, it can be given as 2 shots, 6-12 months apart. In some cases, 3 shots are needed. What are the risks and benefits of the HPV vaccine? Benefits Getting the vaccine can help prevent certain cancers. These include: Mouth or throat cancer. Anal cancer. Cancers of the cervix, vulva, and vagina in females. Penile cancer in males. Your child is less likely to get these cancers if they get the vaccine before they become sexually active. The vaccine also prevents genital warts caused by HPV. Risks In rare cases, side effects and reactions have been reported. These include: Soreness, redness, or swelling at the injection site. Dizziness or fainting. Fever. Headache. Muscle or joint pain. Who should not get the HPV vaccine or wait to get it? Some children should not get the HPV vaccine or should wait to get it. Ask the health care provider if your child should get the vaccine if: Your child has had a severe allergic reaction to other vaccines. Your child is allergic to yeast. Your child has a fever. Your child has had a recent illness. Your child is pregnant or may be pregnant. Where to find more  information To learn more: Go to tonerpromos.no Click Health Topics. Type HPV in the search box. Go to healthychildren.org Type HPV in the search box. This information is not intended to replace advice given to you by your health care provider. Make sure you discuss any questions you have with your health care provider. Document Revised: 09/20/2023 Document Reviewed: 09/20/2023 Elsevier Patient Education  2025 Elsevier Inc.  Influenza Vaccine Injection What is this medication? INFLUENZA VACCINE (in floo EN zuh vak SEEN) reduces the risk of the influenza (flu). It does not treat influenza. It is still possible to get influenza after receiving this vaccine, but the symptoms may be less severe or not last as long. It works by helping your immune system learn how to fight off a future infection. This medicine may be used for other purposes; ask your health care provider or pharmacist if you have questions. COMMON BRAND NAME(S): Afluria Trivalent, FLUAD Trivalent, Fluarix Trivalent, Flublok Trivalent, FLUCELVAX Trivalent, Flulaval Trivalent, Fluzone  Trivalent What should I tell my care team before I take this medication? They need to know if you have any of these conditions: Bleeding disorder like hemophilia Fever or infection Guillain-Barre syndrome or other neurological problems Immune system problems Infection with the human immunodeficiency virus (HIV) or AIDS Low blood platelet counts Multiple sclerosis An unusual or allergic reaction to influenza virus vaccine, latex, other medications, foods, dyes, or preservatives. Different brands of vaccines contain different allergens. Some may contain latex or eggs. Talk to your care team about your allergies to make sure that you get the right vaccine. Pregnant or trying to get pregnant Breastfeeding How should I use this medication? This vaccine is injected into a muscle or under the skin. It is given by your care team. A copy of Vaccine  Information Statements will be given before each vaccination. Be sure to read this sheet carefully each time. This sheet may change often. Talk to your care team to see which vaccines are right for you. Some vaccines should not be used in all age groups. Overdosage: If you think you have taken too much of this medicine contact a poison control center or emergency room at once. NOTE: This medicine is only for you. Do not share this medicine with others. What if I miss a dose? This does not apply. What may interact with this medication? Certain medications that lower your immune system, such as etanercept, anakinra, infliximab, adalimumab Certain medications that prevent or treat blood clots, such as warfarin Chemotherapy or radiation therapy Phenytoin Steroid medications, such as prednisone  or cortisone Theophylline Vaccines This list may not describe all possible interactions. Give your health care provider a list of all the medicines, herbs, non-prescription drugs, or dietary supplements you use. Also tell them if you smoke, drink alcohol, or use illegal drugs. Some items may interact with your medicine. What should I watch for while using this medication? Report any side effects that do not go away with your care team. Call your care team if any unusual symptoms occur within 6 weeks of receiving this vaccine. You may still catch the flu, but the illness is not usually as bad. You cannot get the flu from the vaccine. The vaccine will not protect against colds or other illnesses that may cause fever. The vaccine is needed every year. What side effects may I notice from receiving this medication? Side effects that you should report to your care team as soon as possible: Allergic reactions--skin rash, itching, hives, swelling of the face, lips, tongue, or throat Side effects that usually do not require medical attention (report these to your care team if they continue or are  bothersome): Chills Fatigue Headache Joint pain Loss of appetite Muscle pain Nausea Pain, redness, or irritation at injection site This list may not describe all possible side effects. Call your doctor for medical advice about side effects. You may report side effects to FDA at 1-800-FDA-1088. Where should I keep my medication? The vaccine is only given by your care team. It will not be stored at home. NOTE: This sheet is a summary. It may not cover all possible information. If you have questions about this medicine, talk to your doctor, pharmacist, or health care provider.  2024 Elsevier/Gold Standard (2022-05-29 00:00:00)

## 2024-11-02 NOTE — Progress Notes (Signed)
 [facility]   [name] is a 33 y.o. female presents for hypertension evaluation, Denies shortness of breath, headaches, chest pain or lower extremity edema, sudden onset, vision changes, unilateral weakness, dizziness, paresthesias   Patient denies adherence with medications.  Patient provides information that she was out of town for a week and left her medication therefore blood pressure is elevated and has possession of her medication and will take as prescribed. She is concerned about weight loss she was doing well on Wegovy  and she will September 30, 2024 when her insurance stopped authorizing the use of it.  While on Wegovy  she lost 10 pounds.  We discussed risk of morbid obesity hypoventilation syndrome, diabetes, hypertension and respiratory problems.  Explained to patient provider will not give her any weight loss medication until blood pressure is controlled her insurance will pay for phentermine that can calls side effects including increase in BP.  Stop Dietary habits include: She has decreased carbohydrates and monitoring sodium Exercise habits include: She is exercising on a regular basis    Past Medical History:  Diagnosis Date   Asthma    Hypertension    Obesity    Past Surgical History:  Procedure Laterality Date   CHOLECYSTECTOMY     TONSILLECTOMY     No Known Allergies Current Outpatient Medications on File Prior to Visit  Medication Sig Dispense Refill   albuterol  (VENTOLIN  HFA) 108 (90 Base) MCG/ACT inhaler Inhale 2 puffs into the lungs every 4 (four) hours as needed for wheezing or shortness of breath. 8.5 each 1   amLODipine  (NORVASC ) 10 MG tablet Take 1 tablet (10 mg total) by mouth daily. Please schedule an appointment for further refills. 90 tablet 1   hydrochlorothiazide  (HYDRODIURIL ) 25 MG tablet Take 1 tablet (25 mg total) by mouth daily. 90 tablet 1   lidocaine  (XYLOCAINE ) 2 % solution Use as directed 15 mLs in the mouth or throat as  needed for mouth pain. 100 mL 0   No current facility-administered medications on file prior to visit.   Social History   Socioeconomic History   Marital status: Married    Spouse name: Not on file   Number of children: Not on file   Years of education: Not on file   Highest education level: Associate degree: academic program  Occupational History   Not on file  Tobacco Use   Smoking status: Never   Smokeless tobacco: Never  Vaping Use   Vaping status: Never Used  Substance and Sexual Activity   Alcohol use: Not Currently   Drug use: No   Sexual activity: Yes    Birth control/protection: Inserts  Other Topics Concern   Not on file  Social History Narrative   Not on file   Social Drivers of Health   Financial Resource Strain: Medium Risk (11/02/2024)   Overall Financial Resource Strain (CARDIA)    Difficulty of Paying Living Expenses: Somewhat hard  Food Insecurity: No Food Insecurity (11/02/2024)   Hunger Vital Sign    Worried About Running Out of Food in the Last Year: Never true    Ran Out of Food in the Last Year: Never true  Transportation Needs: No Transportation Needs (11/02/2024)   PRAPARE - Administrator, Civil Service (Medical): No    Lack of Transportation (Non-Medical): No  Physical Activity: Insufficiently Active (11/02/2024)   Exercise Vital Sign    Days of Exercise per Week: 2 days    Minutes of Exercise per Session:  30 min  Stress: Stress Concern Present (11/02/2024)   [employer] of Occupational Health - Occupational Stress Questionnaire    Feeling of Stress: Very much  Social Connections: Moderately Integrated (11/02/2024)   Social Connection and Isolation Panel    Frequency of Communication with Friends and Family: More than three times a week    Frequency of Social Gatherings with Friends and Family: More than three times a week    Attends Religious Services: More than 4 times per year    Active Member of Clubs or Organizations: Yes     Attends Banker Meetings: More than 4 times per year    Marital Status: Separated  Intimate Partner Violence: Not At Risk (04/05/2023)   Received from [facility]   HITS    Over the last 12 months how often did your partner physically hurt you?: 1    Over the last 12 months how often did your partner insult you or talk down to you?: 1    Over the last 12 months how often did your partner threaten you with physical harm?: 1    Over the last 12 months how often did your partner scream or curse at you?: 1   Family History  Problem Relation Age of Onset   Anesthesia problems Neg Hx    Hypotension Neg Hx    Malignant hyperthermia Neg Hx    Pseudochol deficiency Neg Hx    Health Maintenance  Topic Date Due   HPV VACCINES (3 - 3-dose series) 12/18/2007   Pneumococcal Vaccine (1 of 2 - PCV) Never done   Influenza Vaccine  07/31/2024   COVID-19 Vaccine (4 - 2025-26 season) 08/31/2024   Cervical Cancer Screening (HPV/Pap Cotest)  03/16/2025   DTaP/Tdap/Td (9 - Td or Tdap) 03/14/2031   Hepatitis B Vaccines 19-59 Average Risk  Completed   Hepatitis C Screening  Completed   HIV Screening  Completed   Meningococcal B Vaccine  Aged Out     OBJECTIVE:  Vitals:   11/02/24 0844 11/02/24 0846  BP: (!) 142/91 (!) 158/103  Pulse: 100   Resp: 16   SpO2: 100%   Weight: 260 lb (117.9 kg)   Height: 5' 4 (1.626 m)     Physical Exam Vitals reviewed.  Constitutional:      Appearance: Normal appearance. She is obese.     Comments: morbid  HENT:     Head: Normocephalic.     Right Ear: Tympanic membrane, ear canal and external ear normal.     Left Ear: Tympanic membrane, ear canal and external ear normal.     Nose: Nose normal.     Mouth/Throat:     Mouth: Mucous membranes are moist.  Eyes:     Extraocular Movements: Extraocular movements intact.     Pupils: Pupils are equal, round, and reactive to light.  Cardiovascular:     Rate and Rhythm: Normal rate.  Pulmonary:      Effort: Pulmonary effort is normal.     Breath sounds: Normal breath sounds.  Abdominal:     General: Bowel sounds are normal.     Palpations: Abdomen is soft.  Musculoskeletal:        General: Normal range of motion.     Cervical back: Normal range of motion.  Skin:    General: Skin is warm and dry.  Neurological:     Mental Status: She is alert and oriented to person, place, and time.  Psychiatric:  Mood and Affect: Mood normal.        Behavior: Behavior normal.        Thought Content: Thought content normal.      ROS  Last 3 Office BP readings: BP Readings from Last 3 Encounters:  11/02/24 (!) 158/103  11/15/23 130/85  10/24/23 (!) 143/93    BMET    Component Value Date/Time   NA 139 10/24/2023 1109   K 4.4 10/24/2023 1109   CL 101 10/24/2023 1109   CO2 23 10/24/2023 1109   GLUCOSE 78 10/24/2023 1109   GLUCOSE 115 (H) 10/29/2016 2137   BUN 9 10/24/2023 1109   CREATININE 0.85 10/24/2023 1109   CREATININE 0.88 09/26/2015 0848   CALCIUM  9.4 10/24/2023 1109   GFRNONAA 79 12/10/2017 1547   GFRNONAA >89 09/26/2015 0848   GFRAA 91 12/10/2017 1547   GFRAA >89 09/26/2015 0848    Renal function: CrCl cannot be calculated (Patient's most recent lab result is older than the maximum 21 days allowed.).  Clinical ASCVD: No  The ASCVD Risk score (Arnett DK, et al., 2019) failed to calculate for the following reasons:   The 2019 ASCVD risk score is only valid for ages 1 to 7  ASCVD risk factors include- CHAD   ASSESSMENT & PLAN: Tasmia was seen today for medication management and hypertension.  Diagnoses and all orders for this visit:  Weight management advice declined  Encounter for medication refill -     amLODipine  (NORVASC ) 10 MG tablet; Take 1 tablet (10 mg total) by mouth daily. Please schedule an appointment for further refills. -     hydrochlorothiazide  (HYDRODIURIL ) 25 MG tablet; Take 1 tablet (25 mg total) by mouth daily.  Encounter for  surveillance of contraceptive pills -     norgestimate -ethinyl estradiol  (ORTHO-CYCLEN) 0.25-35 MG-MCG tablet; Take 1 tablet by mouth daily.  Primary hypertension   -Counseled on lifestyle modifications for blood pressure control including reduced dietary sodium, increased exercise, weight reduction and adequate sleep. Also, educated patient about the risk for cardiovascular events, stroke and heart attack. Also counseled patient about the importance of medication adherence. If you participate in smoking, it is important to stop using tobacco as this will increase the risks associated with uncontrolled blood pressure.   -     CMP14+EGFR -     amLODipine  (NORVASC ) 10 MG tablet; Take 1 tablet (10 mg total) by mouth daily. Please schedule an appointment for further refills. -     hydrochlorothiazide  (HYDRODIURIL ) 25 MG tablet; Take 1 tablet (25 mg total) by mouth daily.  Pre-diabetes -     CBC with Differential/Platelet -     Hemoglobin A1c  Intermittent asthma, well controlled  Vitamin D  deficiency -     VITAMIN D  25 Hydroxy (Vit-D Deficiency, Fractures)  Mixed hyperlipidemia -     Lipid panel    This note has been created with Education officer, environmental. Any transcriptional errors are unintentional.   Stephanie SHAUNNA Bohr, NP 11/02/2024, 8:55 AM

## 2024-11-02 NOTE — Progress Notes (Signed)
 Medication Samples have been provided to the patient.  Drug name: Wegovy     Strength: 0.25mg         Qty: 1 box LOT: [id number]  Exp.Date: 10/30/2025  Dosing instructions: Inject 0.25MG  once a week   The patient has been instructed regarding the correct time, dose, and frequency of taking this medication, including desired effects and most common side effects.   Jay'A R Robertson 1:40 PM 11/02/2024

## 2024-11-03 LAB — LIPID PANEL

## 2024-11-04 LAB — CMP14+EGFR
ALT: 30 IU/L (ref 0–32)
AST: 18 IU/L (ref 0–40)
Albumin: 4.4 g/dL (ref 3.9–4.9)
Alkaline Phosphatase: 115 IU/L (ref 41–116)
BUN/Creatinine Ratio: 14 (ref 9–23)
BUN: 12 mg/dL (ref 6–20)
Bilirubin Total: 0.4 mg/dL (ref 0.0–1.2)
CO2: 23 mmol/L (ref 20–29)
Calcium: 9.6 mg/dL (ref 8.7–10.2)
Chloride: 99 mmol/L (ref 96–106)
Creatinine, Ser: 0.84 mg/dL (ref 0.57–1.00)
Globulin, Total: 3.5 g/dL (ref 1.5–4.5)
Glucose: 86 mg/dL (ref 70–99)
Potassium: 3.9 mmol/L (ref 3.5–5.2)
Sodium: 139 mmol/L (ref 134–144)
Total Protein: 7.9 g/dL (ref 6.0–8.5)
eGFR: 93 mL/min/1.73 (ref >59)

## 2024-11-04 LAB — CBC WITH DIFFERENTIAL/PLATELET
Basophils Absolute: 0 x10E3/uL (ref 0.0–0.2)
Basos: 1 %
EOS (ABSOLUTE): 0.1 x10E3/uL (ref 0.0–0.4)
Eos: 2 %
Hematocrit: 43.3 % (ref 34.0–46.6)
Hemoglobin: 13.8 g/dL (ref 11.1–15.9)
Immature Grans (Abs): 0 x10E3/uL (ref 0.0–0.1)
Immature Granulocytes: 0 %
Lymphocytes Absolute: 1.4 x10E3/uL (ref 0.7–3.1)
Lymphs: 29 %
MCH: 26.8 pg (ref 26.6–33.0)
MCHC: 31.9 g/dL (ref 31.5–35.7)
MCV: 84 fL (ref 79–97)
Monocytes Absolute: 0.4 x10E3/uL (ref 0.1–0.9)
Monocytes: 7 %
Neutrophils Absolute: 2.9 x10E3/uL (ref 1.4–7.0)
Neutrophils: 61 %
Platelets: 435 x10E3/uL (ref 150–450)
RBC: 5.14 x10E6/uL (ref 3.77–5.28)
RDW: 14.6 % (ref 11.7–15.4)
WBC: 4.9 x10E3/uL (ref 3.4–10.8)

## 2024-11-04 LAB — LIPID PANEL
Cholesterol, Total: 203 mg/dL — AB (ref 100–199)
HDL: 68 mg/dL (ref >39)
LDL CALC COMMENT:: 3 ratio (ref 0.0–4.4)
LDL Chol Calc (NIH): 119 mg/dL — AB (ref 0–99)
Triglycerides: 91 mg/dL (ref 0–149)
VLDL Cholesterol Cal: 16 mg/dL (ref 5–40)

## 2024-11-04 LAB — HEMOGLOBIN A1C
Est. average glucose Bld gHb Est-mCnc: 108 mg/dL
Hgb A1c MFr Bld: 5.4 % (ref 4.8–5.6)

## 2024-11-04 LAB — VITAMIN D 25 HYDROXY (VIT D DEFICIENCY, FRACTURES): Vit D, 25-Hydroxy: 9.7 ng/mL — ABNORMAL LOW (ref 30.0–100.0)

## 2024-11-06 ENCOUNTER — Ambulatory Visit (INDEPENDENT_AMBULATORY_CARE_PROVIDER_SITE_OTHER): Payer: Self-pay | Admitting: Primary Care

## 2024-11-06 DIAGNOSIS — E559 Vitamin D deficiency, unspecified: Secondary | ICD-10-CM

## 2024-11-06 MED ORDER — ERGOCALCIFEROL 1.25 MG (50000 UT) PO CAPS: 50000.0000 [IU] | ORAL_CAPSULE | ORAL | 0 refills | Status: AC

## 2024-11-08 ENCOUNTER — Other Ambulatory Visit (INDEPENDENT_AMBULATORY_CARE_PROVIDER_SITE_OTHER): Payer: Self-pay | Admitting: Primary Care

## 2024-11-08 DIAGNOSIS — J45909 Unspecified asthma, uncomplicated: Secondary | ICD-10-CM

## 2024-11-09 NOTE — Telephone Encounter (Signed)
 Requested Prescriptions  Pending Prescriptions Disp Refills   albuterol  (VENTOLIN  HFA) 108 (90 Base) MCG/ACT inhaler [Pharmacy Med Name: VENTOLIN  HFA 90 MCG INHALER] 8 g 0    Sig: INHALE 2 PUFFS INTO THE LUNGS UP TO EVERY 4 HOURS AS NEEDED FOR WHEEZING OR SHORTNESS OF BREATH.     Pulmonology:  Beta Agonists 2 Failed - 11/09/2024  4:30 PM      Failed - Last BP in normal range    BP Readings from Last 1 Encounters:  11/02/24 (!) 138/92         Passed - Last Heart Rate in normal range    Pulse Readings from Last 1 Encounters:  11/02/24 100         Passed - Valid encounter within last 12 months    Recent Outpatient Visits           1 week ago Weight management advice declined   [place] [facility] Celestia Rosaline SQUIBB, NP   1 year ago Annual physical exam   [place] [facility] Celestia Rosaline SQUIBB, NP   1 year ago Asthma, unspecified asthma severity, unspecified whether complicated, unspecified whether persistent   [place] [facility] Celestia Rosaline SQUIBB, NP

## 2024-12-03 ENCOUNTER — Ambulatory Visit (INDEPENDENT_AMBULATORY_CARE_PROVIDER_SITE_OTHER): Admitting: Primary Care

## 2024-12-13 ENCOUNTER — Other Ambulatory Visit (INDEPENDENT_AMBULATORY_CARE_PROVIDER_SITE_OTHER): Payer: Self-pay | Admitting: Primary Care

## 2024-12-13 DIAGNOSIS — J45909 Unspecified asthma, uncomplicated: Secondary | ICD-10-CM

## 2025-01-29 ENCOUNTER — Telehealth: Payer: Self-pay | Admitting: Primary Care

## 2025-01-29 NOTE — Telephone Encounter (Signed)
 01/29/25 left message confirming appointment

## 2025-02-02 ENCOUNTER — Ambulatory Visit (INDEPENDENT_AMBULATORY_CARE_PROVIDER_SITE_OTHER): Payer: Self-pay | Admitting: Primary Care
